# Patient Record
Sex: Male | Born: 1990 | Race: Black or African American | Hispanic: No | Marital: Single | Smoking: Current some day smoker
Health system: Southern US, Community
[De-identification: ages and names within clinical notes are randomized; demographics above are authoritative.]

## PROBLEM LIST (undated history)

## (undated) HISTORY — PX: HIP SURGERY: SHX245

---
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
## 2010-02-28 ENCOUNTER — Emergency Department (HOSPITAL_COMMUNITY): Admission: EM | Admit: 2010-02-28 | Discharge: 2010-02-28 | Payer: Self-pay | Admitting: Emergency Medicine

## 2010-09-07 NOTE — H&P (Signed)
NAME:  ILYA, NEELY                          ACCOUNT NO.:  [id number]   MEDICAL RECORD NO.:  [id number]                   PATIENT TYPE:  INP   LOCATION:  A315                                 FACILITY:  [facility]   PHYSICIAN:  J. Darreld Mclean, M.D.              DATE OF BIRTH:  02-Jan-1991   DATE OF ADMISSION:  06/17/2002  DATE OF DISCHARGE:                                HISTORY & PHYSICAL   CHIEF COMPLAINT:  I hurt my hip.   HISTORY OF PRESENT ILLNESS:  The patient is a 20 year old male who fell on  his right hip and had severe pain and tenderness.  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   The patient had previous slipped capital femoral epiphysis on the left hip  that had a pinning approximately two years ago.  He had some limping and  some earlier pain in his leg first part of the week.  Mother thought it was  related to his flat feet, but after he fell was in pain.   PAST SURGICAL HISTORY:  His only surgery was on the left hip, same  condition.   ALLERGIES:  The patient does not have any allergies.   MEDICATIONS:  Taking no medications.   SOCIAL HISTORY:  He is in middle school.   PHYSICAL EXAMINATION:  VITAL SIGNS:  The patient weighs approximately 240  pounds and is approximately 5 feet 6.  HEENT:  Negative.  GENERAL:  He is alert, cooperative, oriented.  NECK:  Supple.  LUNGS:  Clear to P&A.  HEART:  Regular without murmur heard.  ABDOMEN:  Soft, nontender, obese.  EXTREMITIES:  Pain, tenderness motion of the right hip.  Right hip is in  traction.  Other extremities in normal limits.  CNS:  Intact.  SKIN:  Intact.   IMPRESSION:  Slipped capital femoral epiphysis right hip status post pinning  of the left slipped capital femoral epiphysis two years ago.   PLAN:  He came in.  Emergency room physician already talked to the  orthopedic physician that did his previous surgery  on his left hip at  [facility].  He was admitted here as it was snowing real bad and  ambulances were  not running.  He will be brought to the [facility]  physicians later today and see if they can make arrangements.  Meanwhile,  will keep him in [place] traction and appropriate analgesia for pain.  Mother  is very familiar with this, understands the situation.                                               Teola Bradley, M.D.  JWK/MEDQ  D:  06/18/2002  T:  06/18/2002  Job:  [id number]

## 2010-09-07 NOTE — Discharge Summary (Signed)
   NAME:  Jesus Short, Jesus Short                          ACCOUNT NO.:  [id number]   MEDICAL RECORD NO.:  [id number]                   PATIENT TYPE:  INP   LOCATION:  A315                                 FACILITY:  [facility]   PHYSICIAN:  J. Darreld Mclean, M.D.              DATE OF BIRTH:  1990-11-15   DATE OF ADMISSION:  DATE OF DISCHARGE:  06/18/2002                                 DISCHARGE SUMMARY   DISCHARGE DIAGNOSES:  1. 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  2. Status post pinning slipped capital femoral epiphysis 10 years ago.   DISCHARGE DISPOSITION:  The patient is discharged to [facility] where he is to undergo pinning of his right hip for slipped femoral  capital epiphysis.  He is to follow up with the physicians there since they  did his first surgery.   DISCHARGE STATUS:  Stable.   PROGNOSIS:  Good.   DISCHARGE MEDICATIONS:  None.   BRIEF HISTORY AND PRESENT AND HOSPITAL COURSE:  The patient is a 20 year old  African-American male who is admitted via the emergency room because of  severe pain to his right hip.  X-rays at the time of the emergency room  visit show that he has had a significant slipped femoral capital epiphysis  on the right.  Also there is a pin noted to the left hip from previous  procedure done 2 years ago for the same problem.   Mother stated that child was complaining of hip pain that she thought was  related to flat feet.  He had pain for a few weeks prior to admission to the  hospital.   The patient was then transferred on [facility] on  06/18/2002 for pinning of his right hip, slipped capital femoral epiphysis.     Candace Cruise, Doylene Bode, M.D.    BB/MEDQ  D:  07/06/2002  T:  07/06/2002  Job:  [id number]

## 2013-07-13 ENCOUNTER — Encounter (HOSPITAL_COMMUNITY): Payer: Self-pay | Admitting: Emergency Medicine

## 2013-07-13 ENCOUNTER — Emergency Department (HOSPITAL_COMMUNITY)
Admission: EM | Admit: 2013-07-13 | Discharge: 2013-07-13 | Disposition: A | Discharge status: Home / Self Care | Payer: Self-pay | Attending: Emergency Medicine | Admitting: Emergency Medicine

## 2013-07-13 ENCOUNTER — Emergency Department (HOSPITAL_COMMUNITY): Payer: Self-pay

## 2013-07-13 DIAGNOSIS — L309 Dermatitis, unspecified: Secondary | ICD-10-CM

## 2013-07-13 DIAGNOSIS — Y9301 Activity, walking, marching and hiking: Secondary | ICD-10-CM | POA: Insufficient documentation

## 2013-07-13 DIAGNOSIS — L259 Unspecified contact dermatitis, unspecified cause: Secondary | ICD-10-CM | POA: Insufficient documentation

## 2013-07-13 DIAGNOSIS — F17200 Nicotine dependence, unspecified, uncomplicated: Secondary | ICD-10-CM | POA: Insufficient documentation

## 2013-07-13 DIAGNOSIS — Y9289 Other specified places as the place of occurrence of the external cause: Secondary | ICD-10-CM | POA: Insufficient documentation

## 2013-07-13 DIAGNOSIS — Y990 Civilian activity done for income or pay: Secondary | ICD-10-CM | POA: Insufficient documentation

## 2013-07-13 DIAGNOSIS — X500XXA Overexertion from strenuous movement or load, initial encounter: Secondary | ICD-10-CM | POA: Insufficient documentation

## 2013-07-13 DIAGNOSIS — S93409A Sprain of unspecified ligament of unspecified ankle, initial encounter: Secondary | ICD-10-CM | POA: Insufficient documentation

## 2013-07-13 MED ORDER — IBUPROFEN 800 MG PO TABS: 800.0000 mg | ORAL_TABLET | Freq: Three times a day (TID) | ORAL | Status: DC

## 2013-07-13 MED ORDER — IBUPROFEN 800 MG PO TABS
800.0000 mg | ORAL_TABLET | Freq: Once | ORAL | Status: AC
  Administered 2013-07-13: 800 mg via ORAL
  Filled 2013-07-13: qty 1

## 2013-07-13 MED ORDER — TRIAMCINOLONE ACETONIDE 0.1 % EX CREA: 1.0000 "application " | TOPICAL_CREAM | Freq: Two times a day (BID) | CUTANEOUS | Status: AC

## 2013-07-13 NOTE — ED Provider Notes (Signed)
CSN: [id number]     Arrival date & time 07/13/13  1857 History   First MD Initiated Contact with Patient 07/13/13 2004     Chief Complaint  Patient presents with  . Ankle Injury     (Consider location/radiation/quality/duration/timing/severity/associated sxs/prior Treatment) HPI Comments: Patient states he slipped on some muddy unlevel ground while at work today, and twisted his right ankle. He's been unable to apply weight without pain since that time. Patient is a 23 y.o. male presenting with ankle pain. The history is provided by the patient.  Ankle Pain Location:  Ankle Time since incident:  8 hours Injury: yes   Mechanism of injury comment:  Pt twisted the ankle while walking on unlevel ground Ankle location:  R ankle Pain details:    Quality:  Aching   Radiates to:  Does not radiate   Severity:  Moderate   Onset quality:  Sudden   Duration:  8 hours   Timing:  Constant   Progression:  Worsening Chronicity:  New Dislocation: no   Prior injury to area:  No Relieved by:  Nothing Exacerbated by: standing and walking. Associated symptoms: no back pain and no neck pain   Risk factors: no recent illness     History reviewed. No pertinent past medical history. Past Surgical History  Procedure Laterality Date  . Hip surgery     History reviewed. No pertinent family history. History  Substance Use Topics  . Smoking status: Current Every Day Smoker -- 0.50 packs/day  . Smokeless tobacco: Not on file  . Alcohol Use: Yes     Comment: occasional    Review of Systems  Constitutional: Negative for activity change.       All ROS Neg except as noted in HPI  HENT: Negative for nosebleeds.   Eyes: Negative for photophobia and discharge.  Respiratory: Negative for cough, shortness of breath and wheezing.   Cardiovascular: Negative for chest pain and palpitations.  Gastrointestinal: Negative for abdominal pain and blood in stool.  Genitourinary: Negative for dysuria,  frequency and hematuria.  Musculoskeletal: Negative for arthralgias, back pain and neck pain.  Skin: Negative.   Neurological: Negative for dizziness, seizures and speech difficulty.  Psychiatric/Behavioral: Negative for hallucinations and confusion.      Allergies  Review of patient's allergies indicates not on file.  Home Medications  No current outpatient prescriptions on file. BP 145/86  Pulse 76  Temp(Src) 98.6 F (37 C) (Oral)  Resp 18  Ht 5\' 6"  (1.676 m)  Wt 280 lb (127.007 kg)  BMI 45.21 kg/m2  SpO2 100% Physical Exam  Nursing note and vitals reviewed. Constitutional: He is oriented to person, place, and time. He appears well-developed and well-nourished.  Non-toxic appearance.  HENT:  Head: Normocephalic.  Right Ear: Tympanic membrane and external ear normal.  Left Ear: Tympanic membrane and external ear normal.  Eyes: EOM and lids are normal. Pupils are equal, round, and reactive to light.  Neck: Normal range of motion. Neck supple. Carotid bruit is not present.  Dry scaling rash of the neck. No red streaking. No drainage.  Cardiovascular: Normal rate, regular rhythm, normal heart sounds, intact distal pulses and normal pulses.   Pulmonary/Chest: Breath sounds normal. No respiratory distress.  Abdominal: Soft. Bowel sounds are normal. There is no tenderness. There is no guarding.  Musculoskeletal: Normal range of motion.  There is pain and moderate swelling of the right lateral malleolus. The Achilles tendon on the right is intact. The capillary refill is less  than 2 seconds. The dorsalis pedis pulse on the right and left 2+. There is full range of motion of the right hip and knee.  Lymphadenopathy:       Head (right side): No submandibular adenopathy present.       Head (left side): No submandibular adenopathy present.    He has no cervical adenopathy.  Neurological: He is alert and oriented to person, place, and time. He has normal strength. No cranial nerve  deficit or sensory deficit.  Skin: Skin is warm and dry.  Psychiatric: He has a normal mood and affect. His speech is normal.    ED Course  Procedures (including critical care time) Labs Review Labs Reviewed - No data to display Imaging Review Dg Ankle Complete Right  07/13/2013   CLINICAL DATA:  Lateral right ankle pain.  Injury at work.  EXAM: RIGHT ANKLE - COMPLETE 3+ VIEW  COMPARISON:  None.  FINDINGS: Mild soft swelling is present over the lateral malleolus. There is no underlying fracture. The ankle joint is located. No significant effusion is present.  IMPRESSION: Mild soft tissue swelling over the lateral malleolus without an underlying fracture. Ligamentous injury is not excluded.   Electronically Signed   By: Gennette Pac M.D.   On: 07/13/2013 20:14     EKG Interpretation None      MDM Xray of the right ankle is negative for fx or dislocation. Pt fitted with ASO splint.Rx for ibuprofen 800mg  given and an ice pack given. Pt has eczema present. Rx for triamcinolone given to the patient.   Final diagnoses:  None    **I have reviewed nursing notes, vital signs, and all appropriate lab and imaging results for this patient.Kathie Dike, PA-C 07/16/13 1233

## 2013-07-13 NOTE — Discharge Instructions (Signed)
Please apply ice to your right ankle. Please keep your ankle elevated above your waist is much as possible. Use crutches until you can safely apply weight to your foot and ankle. Please see the orthopedic specialist listed above, or the orthopedic specialist of your choice if not improving. Please use the ankle stirrup splint for the next 7-10 days. Ankle Sprain An ankle sprain is an injury to the strong, fibrous tissues (ligaments) that hold the bones of your ankle joint together.  CAUSES An ankle sprain is usually caused by a fall or by twisting your ankle. Ankle sprains most commonly occur when you step on the outer edge of your foot, and your ankle turns inward. People who participate in sports are more prone to these types of injuries.  SYMPTOMS   Pain in your ankle. The pain may be present at rest or only when you are trying to stand or walk.  Swelling.  Bruising. Bruising may develop immediately or within 1 to 2 days after your injury.  Difficulty standing or walking, particularly when turning corners or changing directions. DIAGNOSIS  Your caregiver will ask you details about your injury and perform a physical exam of your ankle to determine if you have an ankle sprain. During the physical exam, your caregiver will press on and apply pressure to specific areas of your foot and ankle. Your caregiver will try to move your ankle in certain ways. An X-ray exam may be done to be sure a bone was not broken or a ligament did not separate from one of the bones in your ankle (avulsion fracture).  TREATMENT  Certain types of braces can help stabilize your ankle. Your caregiver can make a recommendation for this. Your caregiver may recommend the use of medicine for pain. If your sprain is severe, your caregiver may refer you to a surgeon who helps to restore function to parts of your skeletal system (orthopedist) or a physical therapist. HOME CARE INSTRUCTIONS   Apply ice to your injury for 1 2 days  or as directed by your caregiver. Applying ice helps to reduce inflammation and pain.  Put ice in a plastic bag.  Place a towel between your skin and the bag.  Leave the ice on for 15-20 minutes at a time, every 2 hours while you are awake.  Only take over-the-counter or prescription medicines for pain, discomfort, or fever as directed by your caregiver.  Elevate your injured ankle above the level of your heart as much as possible for 2 3 days.  If your caregiver recommends crutches, use them as instructed. Gradually put weight on the affected ankle. Continue to use crutches or a cane until you can walk without feeling pain in your ankle.  If you have a plaster splint, wear the splint as directed by your caregiver. Do not rest it on anything harder than a pillow for the first 24 hours. Do not put weight on it. Do not get it wet. You may take it off to take a shower or bath.  You may have been given an elastic bandage to wear around your ankle to provide support. If the elastic bandage is too tight (you have numbness or tingling in your foot or your foot becomes cold and blue), adjust the bandage to make it comfortable.  If you have an air splint, you may blow more air into it or let air out to make it more comfortable. You may take your splint off at night and before taking a  shower or bath. Wiggle your toes in the splint several times per day to decrease swelling. SEEK MEDICAL CARE IF:   You have rapidly increasing bruising or swelling.  Your toes feel extremely cold or you lose feeling in your foot.  Your pain is not relieved with medicine. SEEK IMMEDIATE MEDICAL CARE IF:  Your toes are numb or blue.  You have severe pain that is increasing. MAKE SURE YOU:   Understand these instructions.  Will watch your condition.  Will get help right away if you are not doing well or get worse. Document Released: 04/08/2005 Document Revised: 01/01/2012 Document Reviewed:  04/20/2011 [employer] Patient Information 2014 [place], [place].

## 2013-07-13 NOTE — ED Notes (Signed)
Pt reports he twisted his right ankle on unlevel ground.  States that pain got worse through day as he walked around.

## 2013-07-13 NOTE — ED Notes (Signed)
Pain, swelling rt ankle Twisted today at work

## 2013-07-17 NOTE — ED Provider Notes (Signed)
Medical screening examination/treatment/procedure(s) were performed by non-physician practitioner and as supervising physician I was immediately available for consultation/collaboration.   EKG Interpretation None        Valecia Beske, MD 07/17/13 0940 

## 2015-06-06 ENCOUNTER — Encounter (HOSPITAL_COMMUNITY): Payer: Self-pay | Admitting: *Deleted

## 2015-06-06 ENCOUNTER — Emergency Department (HOSPITAL_COMMUNITY)
Admission: EM | Admit: 2015-06-06 | Discharge: 2015-06-07 | Disposition: A | Discharge status: Home / Self Care | Payer: Self-pay | Attending: Emergency Medicine | Admitting: Emergency Medicine

## 2015-06-06 DIAGNOSIS — Z7952 Long term (current) use of systemic steroids: Secondary | ICD-10-CM | POA: Insufficient documentation

## 2015-06-06 DIAGNOSIS — M791 Myalgia, unspecified site: Secondary | ICD-10-CM

## 2015-06-06 DIAGNOSIS — R531 Weakness: Secondary | ICD-10-CM | POA: Insufficient documentation

## 2015-06-06 DIAGNOSIS — F17200 Nicotine dependence, unspecified, uncomplicated: Secondary | ICD-10-CM | POA: Insufficient documentation

## 2015-06-06 DIAGNOSIS — R062 Wheezing: Secondary | ICD-10-CM | POA: Insufficient documentation

## 2015-06-06 DIAGNOSIS — R059 Cough, unspecified: Secondary | ICD-10-CM

## 2015-06-06 DIAGNOSIS — R05 Cough: Secondary | ICD-10-CM | POA: Insufficient documentation

## 2015-06-06 DIAGNOSIS — J029 Acute pharyngitis, unspecified: Secondary | ICD-10-CM | POA: Insufficient documentation

## 2015-06-06 MED ORDER — ALBUTEROL SULFATE HFA 108 (90 BASE) MCG/ACT IN AERS
2.0000 | INHALATION_SPRAY | RESPIRATORY_TRACT | Status: DC | PRN
  Administered 2015-06-07: 2 via RESPIRATORY_TRACT
  Filled 2015-06-06: qty 6.7

## 2015-06-06 NOTE — ED Notes (Signed)
Pt reports slight fever x 2 days. Pt also reports productive cough.. Pt states " Really I just need a note for work". Pt in no acute distress and well appearing.

## 2015-06-06 NOTE — ED Notes (Signed)
Pt c/o having cold symptoms and feeling weak like he was going to pass out

## 2015-06-06 NOTE — ED Provider Notes (Signed)
CSN: [id number]     Arrival date & time 06/06/15  2127 History  By signing my name below, I, Bethel Born, attest that this documentation has been prepared under the direction and in the presence of Shon Baton, MD. Electronically Signed: Bethel Born, ED Scribe. 06/06/2015. 11:56 PM   Chief Complaint  Patient presents with  . Generalized Body Aches    The history is provided by the patient. No language interpreter was used.   Jesus Short is a 25 y.o. male who presents to the Emergency Department complaining of a subjective fever and chills with onset 2 days ago. He did not measure his fever at home but states that he knows that he has one. Associated symptoms include generalized weakness, cough, and sore throat. He used Nyquil last night. Pt denies nausea, vomiting, chest pain, and SOB.  Pt also denies known sick contact. He is a smoker. Pt is otherwise healthy and takes no daily medication. NKDA.   History reviewed. No pertinent past medical history. Past Surgical History  Procedure Laterality Date  . Hip surgery     History reviewed. No pertinent family history. Social History  Substance Use Topics  . Smoking status: Current Some Day Smoker -- 0.00 packs/day  . Smokeless tobacco: None  . Alcohol Use: Yes     Comment: occasional    Review of Systems  Constitutional: Positive for chills. Fever: subjective.  HENT: Positive for sore throat.   Respiratory: Positive for cough.   Cardiovascular: Negative for chest pain.  Gastrointestinal: Negative for nausea and vomiting.  Neurological: Positive for weakness (generalized).  All other systems reviewed and are negative.   Allergies  Review of patient's allergies indicates no known allergies.  Home Medications   Prior to Admission medications   Medication Sig Start Date End Date Taking? Authorizing Provider  albuterol (PROVENTIL HFA;VENTOLIN HFA) 108 (90 Base) MCG/ACT inhaler Inhale 2 puffs into the lungs every 4  (four) hours as needed for shortness of breath (cough). 06/07/15   Shon Baton, MD  ibuprofen (ADVIL,MOTRIN) 600 MG tablet Take 1 tablet (600 mg total) by mouth every 6 (six) hours as needed for fever or moderate pain. 06/07/15   Shon Baton, MD  triamcinolone cream (KENALOG) 0.1 % Apply 1 application topically 2 (two) times daily. 07/13/13   Ivery Quale, PA-C   BP 108/78 mmHg  Pulse 93  Temp(Src) 100 F (37.8 C) (Oral)  Resp 20  Wt 280 lb (127.007 kg)  SpO2 98% Physical Exam  Constitutional: He is oriented to person, place, and time. He appears well-developed and well-nourished. No distress.  HENT:  Head: Normocephalic and atraumatic.  Cardiovascular: Normal rate, regular rhythm and normal heart sounds.   No murmur heard. Pulmonary/Chest: Effort normal. No respiratory distress. He has wheezes.  Scant expiratory wheeze  Abdominal: Soft. Bowel sounds are normal. There is no tenderness. There is no rebound.  Musculoskeletal: He exhibits no edema.  Neurological: He is alert and oriented to person, place, and time.  Skin: Skin is warm and dry.  Psychiatric: He has a normal mood and affect.  Nursing note and vitals reviewed.   ED Course  Procedures (including critical care time) DIAGNOSTIC STUDIES: Oxygen Saturation is 98% on RA,  normal by my interpretation.    COORDINATION OF CARE: 11:46 PM Discussed treatment plan which includes CXR, EKG, and a breathing treatment with pt at bedside and pt agreed to plan.  Labs Review Labs Reviewed - No data to display  Imaging Review Dg Chest 2 View  06/07/2015  CLINICAL DATA:  25 year old male with fever and productive cough. EXAM: CHEST  2 VIEW COMPARISON:  None. FINDINGS: Two views of the chest do not demonstrate a focal consolidation. There is mild increased interstitial prominence at the left lung base, likely atelectatic changes. Developing pneumonia is less likely. Clinical correlation is recommended. No pleural effusion or  pneumothorax. The cardiac silhouette is within normal limits. No acute osseous pathology. IMPRESSION: No focal consolidation. Electronically Signed   By: Elgie Collard M.D.   On: 06/07/2015 01:16   I have personally reviewed and evaluated these images as part of my medical decision-making.   EKG Interpretation   Date/Time:  Tuesday June 06 2015 23:59:47 EST Ventricular Rate:  87 PR Interval:  136 QRS Duration: 84 QT Interval:  332 QTC Calculation: 399 R Axis:   68 Text Interpretation:  Sinus rhythm Confirmed by HORTON  MD, COURTNEY  ([id number]) on 06/07/2015 1:10:48 AM      MDM   Final diagnoses:  Myalgia  Cough    Patient presents with chills, myalgia, and cough. Nontoxic on exam. Temperature 100. Exam is largely reassuring. Scant wheeze. Patient is a smoker. He was given an inhaler. He told nursing that he felt like "used to pass out. Denies syncope. EKG and orthostatics reassuring. He is able to tolerate fluids. X-ray shows no evidence of pneumonia. Suspect acute viral etiology. Supportive management at home.  After history, exam, and medical workup I feel the patient has been appropriately medically screened and is safe for discharge home. Pertinent diagnoses were discussed with the patient. Patient was given return precautions.   I personally performed the services described in this documentation, which was scribed in my presence. The recorded information has been reviewed and is accurate.    Shon Baton, MD 06/07/15 [phone]

## 2015-06-07 ENCOUNTER — Emergency Department (HOSPITAL_COMMUNITY): Payer: Self-pay

## 2015-06-07 MED ORDER — IBUPROFEN 600 MG PO TABS: 600.0000 mg | ORAL_TABLET | Freq: Four times a day (QID) | ORAL | Status: DC | PRN

## 2015-06-07 MED ORDER — ALBUTEROL SULFATE HFA 108 (90 BASE) MCG/ACT IN AERS: 2.0000 | INHALATION_SPRAY | RESPIRATORY_TRACT | Status: AC | PRN

## 2015-06-07 NOTE — Discharge Instructions (Signed)
Viral Infections °A viral infection can be caused by different types of viruses. Most viral infections are not serious and resolve on their own. However, some infections may cause severe symptoms and may lead to further complications. °SYMPTOMS °Viruses can frequently cause: °· Minor sore throat. °· Aches and pains. °· Headaches. °· Runny nose. °· Different types of rashes. °· Watery eyes. °· Tiredness. °· Cough. °· Loss of appetite. °· Gastrointestinal infections, resulting in nausea, vomiting, and diarrhea. °These symptoms do not respond to antibiotics because the infection is not caused by bacteria. However, you might catch a bacterial infection following the viral infection. This is sometimes called a "superinfection." Symptoms of such a bacterial infection may include: °· Worsening sore throat with pus and difficulty swallowing. °· Swollen neck glands. °· Chills and a high or persistent fever. °· Severe headache. °· Tenderness over the sinuses. °· Persistent overall ill feeling (malaise), muscle aches, and tiredness (fatigue). °· Persistent cough. °· Yellow, green, or brown mucus production with coughing. °HOME CARE INSTRUCTIONS  °· Only take over-the-counter or prescription medicines for pain, discomfort, diarrhea, or fever as directed by your caregiver. °· Drink enough water and fluids to keep your urine clear or pale yellow. Sports drinks can provide valuable electrolytes, sugars, and hydration. °· Get plenty of rest and maintain proper nutrition. Soups and broths with crackers or rice are fine. °SEEK IMMEDIATE MEDICAL CARE IF:  °· You have severe headaches, shortness of breath, chest pain, neck pain, or an unusual rash. °· You have uncontrolled vomiting, diarrhea, or you are unable to keep down fluids. °· You or your child has an oral temperature above 102° F (38.9° C), not controlled by medicine. °· Your baby is older than 3 months with a rectal temperature of 102° F (38.9° C) or higher. °· Your baby is 3  months old or younger with a rectal temperature of 100.4° F (38° C) or higher. °MAKE SURE YOU:  °· Understand these instructions. °· Will watch your condition. °· Will get help right away if you are not doing well or get worse. °  °This information is not intended to replace advice given to you by your health care provider. Make sure you discuss any questions you have with your health care provider. °  °Document Released: 01/16/2005 Document Revised: 07/01/2011 Document Reviewed: 09/14/2014 °Elsevier Interactive Patient Education ©2016 [employer]. ° °

## 2017-01-04 ENCOUNTER — Emergency Department (HOSPITAL_COMMUNITY): Payer: Self-pay

## 2017-01-04 ENCOUNTER — Encounter (HOSPITAL_COMMUNITY): Payer: Self-pay | Admitting: Emergency Medicine

## 2017-01-04 ENCOUNTER — Emergency Department (HOSPITAL_COMMUNITY)
Admission: EM | Admit: 2017-01-04 | Discharge: 2017-01-04 | Disposition: A | Discharge status: Home / Self Care | Payer: Self-pay | Attending: Emergency Medicine | Admitting: Emergency Medicine

## 2017-01-04 DIAGNOSIS — M47896 Other spondylosis, lumbar region: Secondary | ICD-10-CM | POA: Insufficient documentation

## 2017-01-04 DIAGNOSIS — F17210 Nicotine dependence, cigarettes, uncomplicated: Secondary | ICD-10-CM | POA: Insufficient documentation

## 2017-01-04 DIAGNOSIS — M62830 Muscle spasm of back: Secondary | ICD-10-CM | POA: Insufficient documentation

## 2017-01-04 MED ORDER — IBUPROFEN 600 MG PO TABS: 600.0000 mg | ORAL_TABLET | Freq: Four times a day (QID) | ORAL | 0 refills | Status: DC

## 2017-01-04 MED ORDER — CYCLOBENZAPRINE HCL 10 MG PO TABS: 10.0000 mg | ORAL_TABLET | Freq: Three times a day (TID) | ORAL | 0 refills | Status: AC

## 2017-01-04 NOTE — ED Triage Notes (Signed)
Patient c/o lower back pain that radiates into right leg. Patient states started Thursday when he stood up from couch. Denies any complications with BMs or urination. Per patient feels like a spasm.

## 2017-01-04 NOTE — Discharge Instructions (Signed)
This no fracture or dislocation involving her back. 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 Use heating pad to the lower back. Please use Flexeril 3 times daily. Use ibuprofen every 6 hours. Flexeril may cause drowsiness. Please do not drive, drink alcohol, operate machinery, or participated in activities requiring concentration when taking this medication.

## 2017-01-04 NOTE — ED Provider Notes (Signed)
[facility]-EMERGENCY DEPT Provider Note   CSN: [id number] Arrival date & time: 01/04/17  1428     History   Chief Complaint Chief Complaint  Patient presents with  . Back Pain    HPI Jesus Short is a 26 y.o. male.  The history is provided by the patient.  Back Pain   This is a chronic problem. The current episode started 2 days ago. The problem occurs constantly. The problem has been gradually worsening. The pain is associated with no known injury (Hx of right hip deformity.). The pain is present in the lumbar spine. The quality of the pain is described as aching and shooting. The pain radiates to the right thigh. The pain is moderate. The symptoms are aggravated by bending and certain positions. The pain is the same all the time. Stiffness is present all day. Pertinent negatives include no chest pain, no fever, no numbness, no abdominal pain, no bowel incontinence, no perianal numbness, no bladder incontinence and no dysuria. He has tried nothing for the symptoms.    No past medical history on file.  Patient Active Problem List   Diagnosis Date Noted  . ALLERGIC RHINITIS 08/06/2006  . OBESITY, NOS 06/19/2006  . 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 CONSTIPATION 06/19/2006    Past Surgical History:  Procedure Laterality Date  . HIP SURGERY         Home Medications    Prior to Admission medications   Medication Sig Start Date End Date Taking? Authorizing Provider  albuterol (PROVENTIL HFA;VENTOLIN HFA) 108 (90 Base) MCG/ACT inhaler Inhale 2 puffs into the lungs every 4 (four) hours as needed for shortness of breath (cough). 06/07/15   Horton, Mayer Masker, MD  ibuprofen (ADVIL,MOTRIN) 600 MG tablet Take 1 tablet (600 mg total) by mouth every 6 (six) hours as needed for fever or moderate pain. 06/07/15   Horton, Mayer Masker, MD  triamcinolone cream (KENALOG) 0.1 % Apply 1 application topically 2 (two) times daily. 07/13/13   Ivery Quale, PA-C    Family  History No family history on file.  Social History Social History  Substance Use Topics  . Smoking status: Current Some Day Smoker    Packs/day: 0.25    Types: Cigarettes  . Smokeless tobacco: Never Used  . Alcohol use Yes     Comment: occasional     Allergies   Patient has no known allergies.   Review of Systems Review of Systems  Constitutional: Negative for activity change and fever.       All ROS Neg except as noted in HPI  HENT: Negative for nosebleeds.   Eyes: Negative for photophobia and discharge.  Respiratory: Negative for cough, shortness of breath and wheezing.   Cardiovascular: Negative for chest pain and palpitations.  Gastrointestinal: Negative for abdominal pain, blood in stool and bowel incontinence.  Genitourinary: Negative for bladder incontinence, dysuria, frequency and hematuria.  Musculoskeletal: Positive for back pain. Negative for arthralgias and neck pain.  Skin: Negative.   Neurological: Negative for dizziness, seizures, speech difficulty and numbness.  Psychiatric/Behavioral: Negative for confusion and hallucinations.     Physical Exam Updated Vital Signs BP (!) 141/93 (BP Location: Right Arm)   Pulse 65   Temp 98.3 F (36.8 C) (Oral)   Resp 18   Ht  (1.778 m)   Wt 111.1 kg (245 lb)   SpO2 100%   BMI 35.15 kg/m   Physical Exam  Constitutional: He is oriented to person, place, and time. He  appears well-developed and well-nourished.  Non-toxic appearance.  HENT:  Head: Normocephalic.  Right Ear: Tympanic membrane and external ear normal.  Left Ear: Tympanic membrane and external ear normal.  Eyes: Pupils are equal, round, and reactive to light. EOM and lids are normal.  Neck: Normal range of motion. Neck supple. Carotid bruit is not present.  Cardiovascular: Normal rate, regular rhythm, normal heart sounds, intact distal pulses and normal pulses.   Pulmonary/Chest: Breath sounds normal. No respiratory distress.  Abdominal: Soft.  Bowel sounds are normal. There is no tenderness. There is no guarding.  Musculoskeletal:       Lumbar back: He exhibits decreased range of motion, tenderness, pain and spasm.  Lymphadenopathy:       Head (right side): No submandibular adenopathy present.       Head (left side): No submandibular adenopathy present.    He has no cervical adenopathy.  Neurological: He is alert and oriented to person, place, and time. He has normal strength. No cranial nerve deficit or sensory deficit.  No motor or sensory deficit noted.  Skin: Skin is warm and dry.  Psychiatric: He has a normal mood and affect. His speech is normal.  Nursing note and vitals reviewed.    ED Treatments / Results  Labs (all labs ordered are listed, but only abnormal results are displayed) Labs Reviewed - No data to display  EKG  EKG Interpretation None       Radiology No results found.  Procedures Procedures (including critical care time)  Medications Ordered in ED Medications - No data to display   Initial Impression / Assessment and Plan / ED Course  I have reviewed the triage vital signs and the nursing notes.  Pertinent labs & imaging results that were available during my care of the patient were reviewed by me and considered in my medical decision making (see chart for details).       Final Clinical Impressions(s) / ED Diagnoses MDM Vital signs within normal limits.x-ray of the lumbar spine shows mild degenerative changes at T11-T12.  Patient has spasm pain with change of position. No gross neurologic deficits appreciated.  Pt advised to use a heating pad. He will use a muscle relaxer. I have encouraged the patient to see orthopedics for additional evalluation of the back and the degenerative changes.   Final diagnoses:  Spasm of lumbar paraspinous muscle  Other osteoarthritis of spine, lumbar region    New Prescriptions Discharge Medication List as of 01/04/2017  5:38 PM       Ivery Quale, PA-C 01/05/17 Kerry Hough, MD 01/05/17 1513

## 2017-02-08 IMAGING — DX DG CHEST 2V
2 series · 2 of 2 positions shown · non-contrast
Comparison: None.

CLINICAL DATA: 24-year-old male with fever and productive cough.

EXAM:
CHEST  2 VIEW

[chest pa]
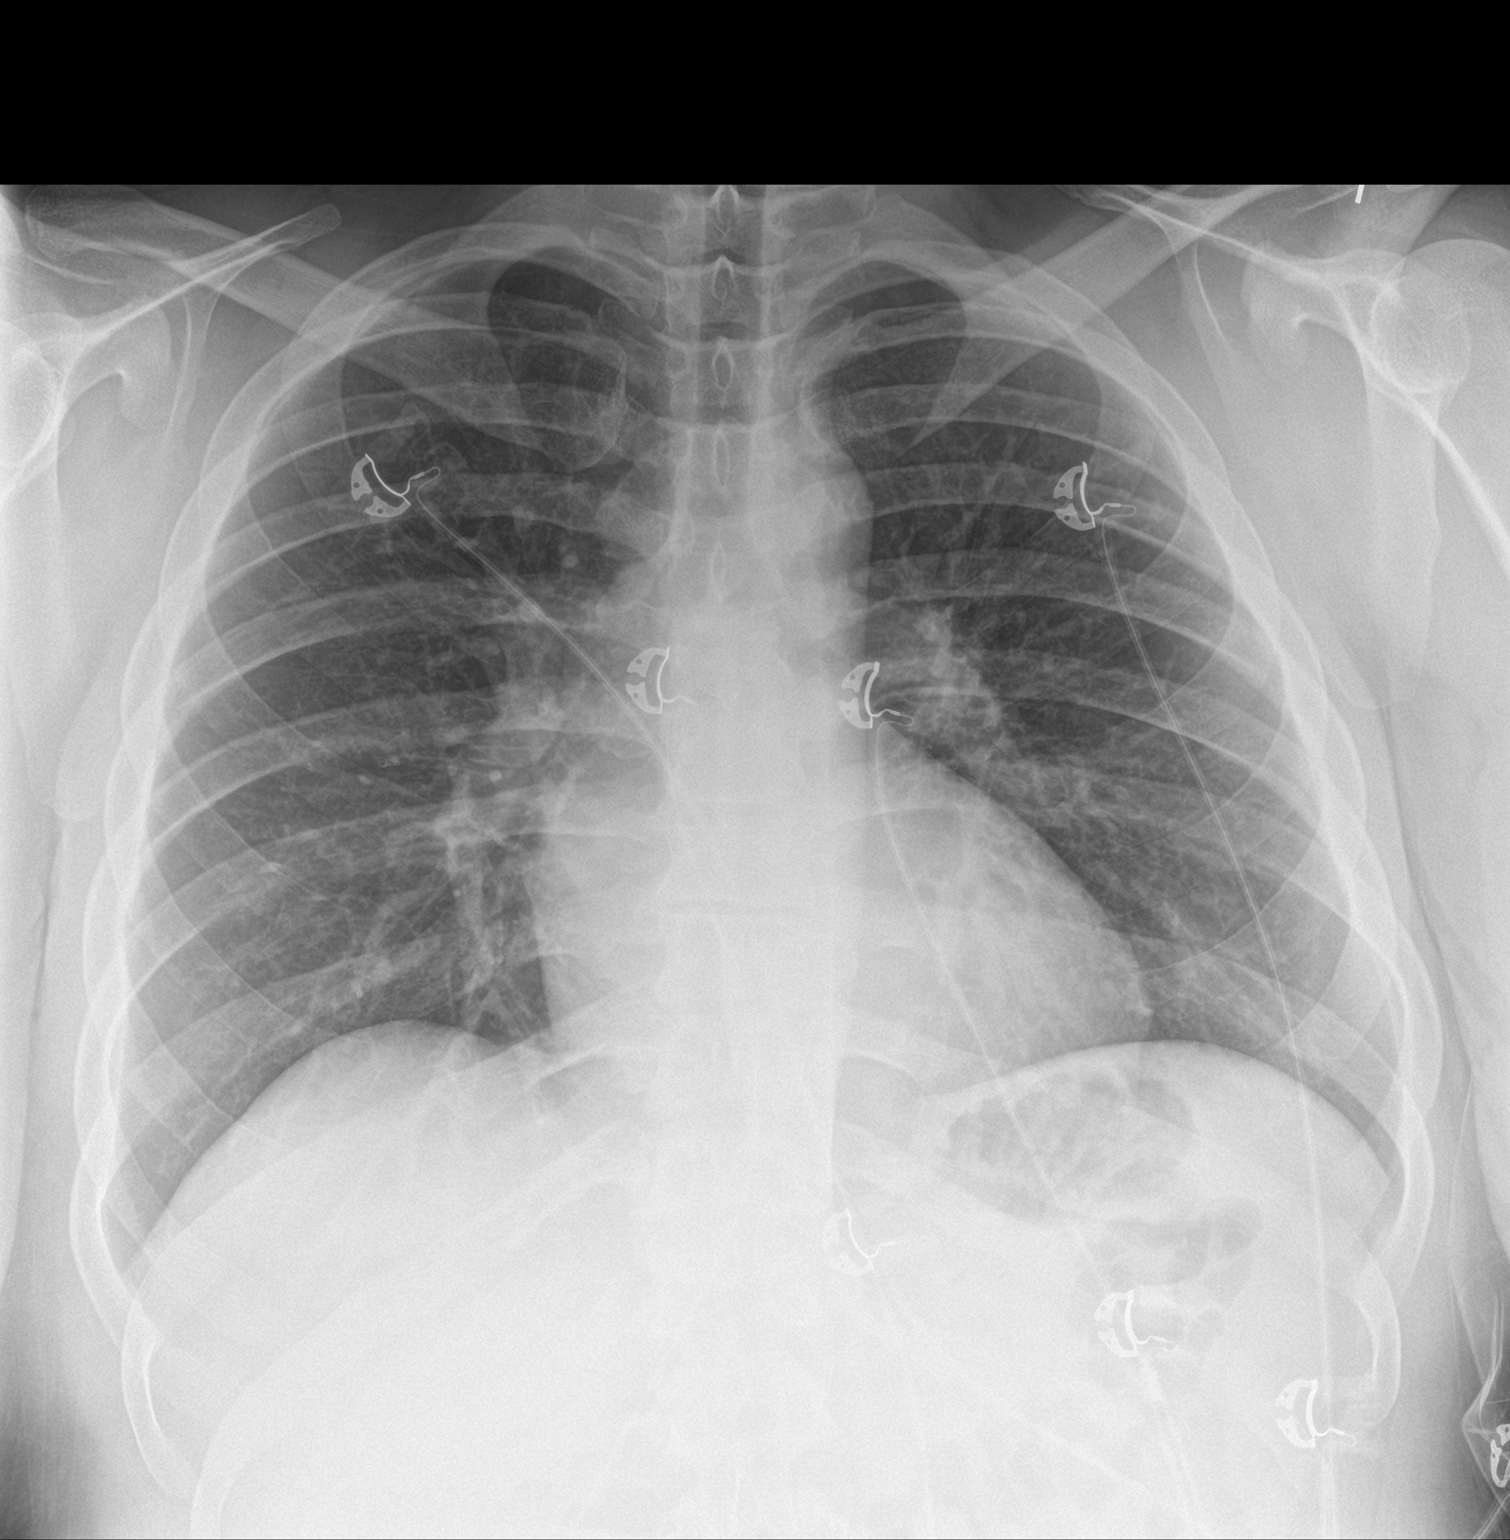

[chest lat]
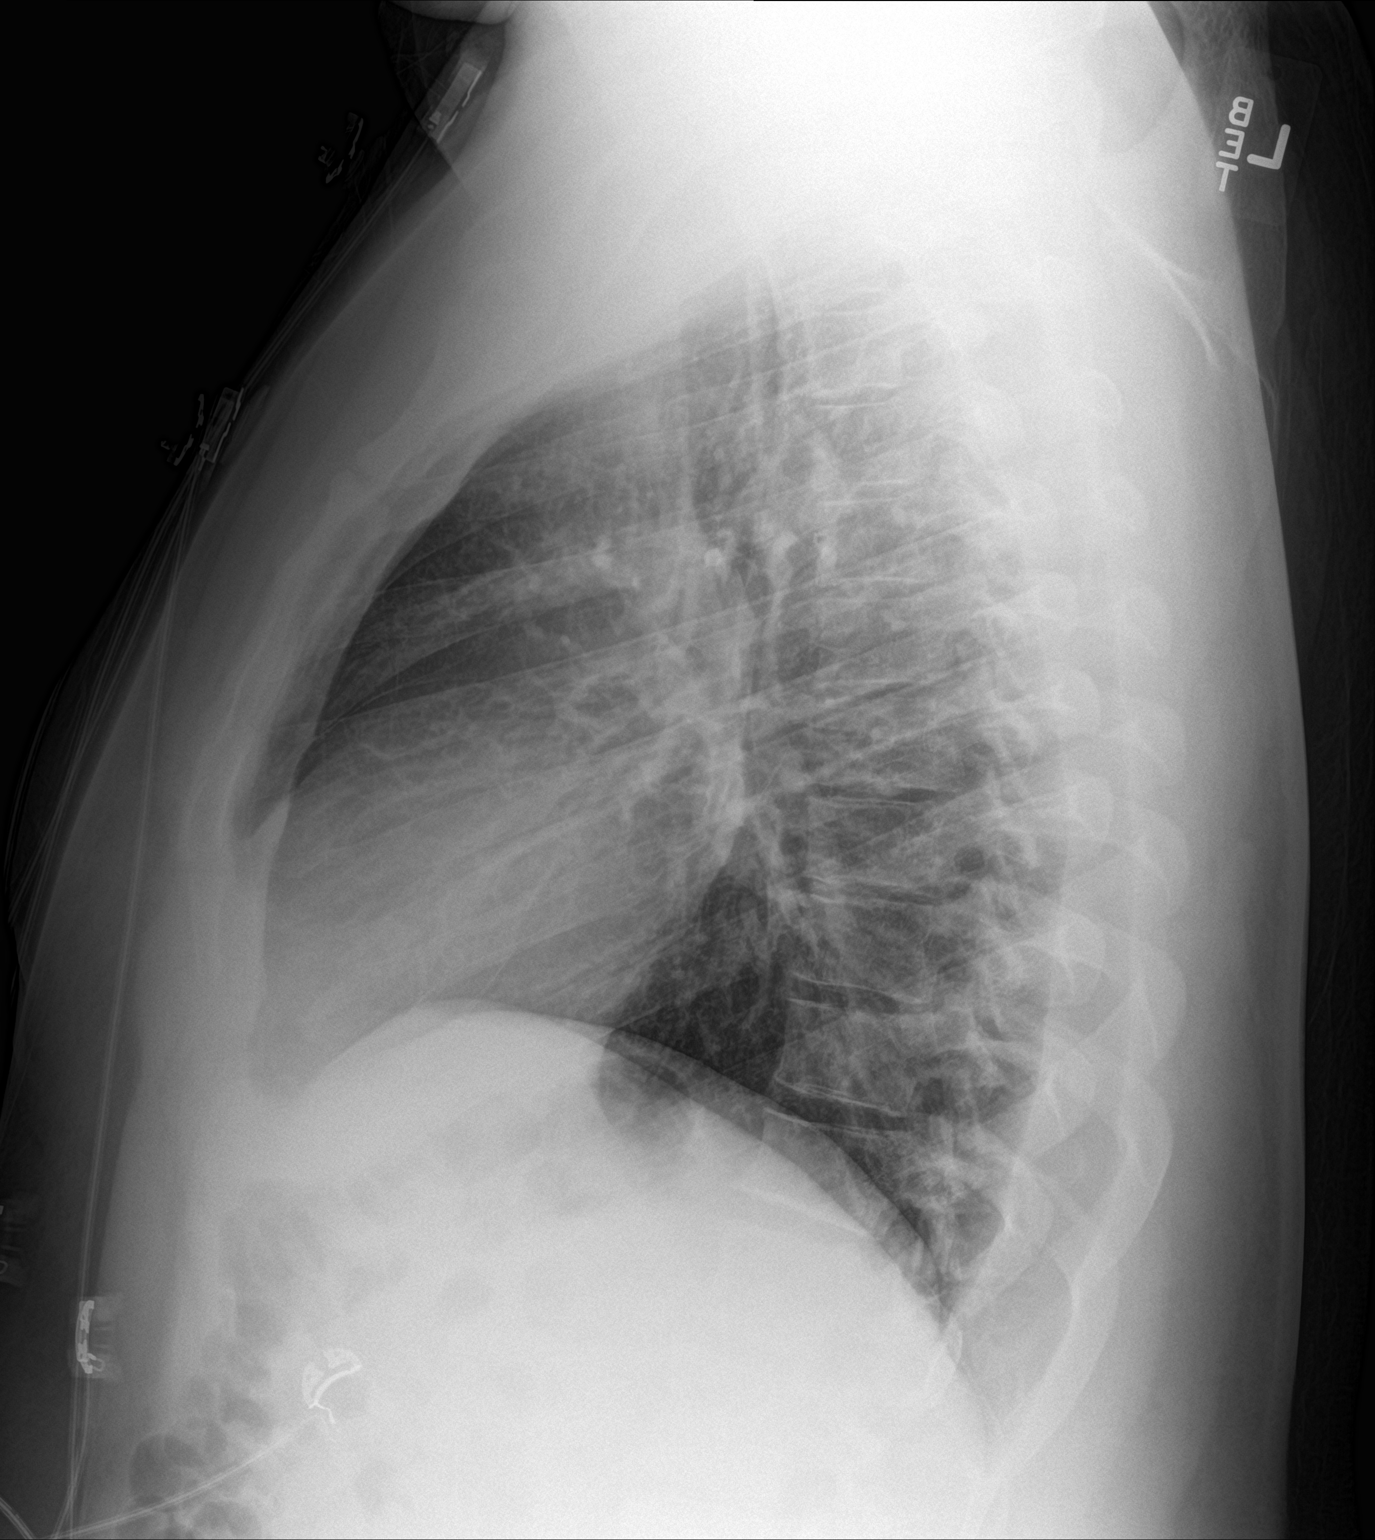

[2 of 2 positions shown; findings below may reference images not displayed]

FINDINGS: Two views of the chest do not demonstrate a focal consolidation.
There is mild increased interstitial prominence at the left lung
base, likely atelectatic changes. Developing pneumonia is less
likely. Clinical correlation is recommended. No pleural effusion or
pneumothorax. The cardiac silhouette is within normal limits. No
acute osseous pathology.
IMPRESSION: No focal consolidation.

## 2018-01-04 ENCOUNTER — Emergency Department (HOSPITAL_COMMUNITY)
Admission: EM | Admit: 2018-01-04 | Discharge: 2018-01-04 | Disposition: A | Discharge status: Home / Self Care | Payer: 59 | Attending: Emergency Medicine | Admitting: Emergency Medicine

## 2018-01-04 ENCOUNTER — Encounter (HOSPITAL_COMMUNITY): Payer: Self-pay | Admitting: Emergency Medicine

## 2018-01-04 DIAGNOSIS — F17210 Nicotine dependence, cigarettes, uncomplicated: Secondary | ICD-10-CM | POA: Diagnosis not present

## 2018-01-04 DIAGNOSIS — J029 Acute pharyngitis, unspecified: Secondary | ICD-10-CM | POA: Insufficient documentation

## 2018-01-04 LAB — GROUP A STREP BY PCR: GROUP A STREP BY PCR: NOT DETECTED

## 2018-01-04 MED ORDER — IBUPROFEN 800 MG PO TABS
800.0000 mg | ORAL_TABLET | Freq: Once | ORAL | Status: AC
  Administered 2018-01-04: 800 mg via ORAL
  Filled 2018-01-04: qty 1

## 2018-01-04 MED ORDER — MAGIC MOUTHWASH W/LIDOCAINE: 10.0000 mL | Freq: Four times a day (QID) | ORAL | 0 refills | Status: AC | PRN

## 2018-01-04 MED ORDER — LIDOCAINE VISCOUS HCL 2 % MT SOLN
15.0000 mL | Freq: Once | OROMUCOSAL | Status: AC
  Administered 2018-01-04: 15 mL via OROMUCOSAL
  Filled 2018-01-04: qty 15

## 2018-01-04 MED ORDER — IBUPROFEN 800 MG PO TABS: 800.0000 mg | ORAL_TABLET | Freq: Three times a day (TID) | ORAL | 0 refills | Status: AC | PRN

## 2018-01-04 NOTE — Discharge Instructions (Addendum)
Your strep test is negative which means antibiotics will not make this better.   Use the medicines prescribed while your throat infection is running its course.  Rest and drink plenty of fluids.

## 2018-01-04 NOTE — ED Provider Notes (Signed)
[facility] EMERGENCY DEPARTMENT Provider Note   CSN: [id number] Arrival date & time: 01/04/18  1314     History   Chief Complaint Chief Complaint  Patient presents with  . Sore Throat    HPI Jesus Short is a 27 y.o. male.  The history is provided by the patient.  Sore Throat  This is a new problem. The current episode started yesterday. The problem occurs constantly. The problem has been gradually worsening. Pertinent negatives include no chest pain, no abdominal pain, no headaches and no shortness of breath. Associated symptoms comments: He denies nasal congestion, ear pain, headache, neck pain, sob, cough, no chest pain.  Subjective fever (chills).. The symptoms are aggravated by swallowing. Nothing relieves the symptoms. Treatments tried: otc throat spray. The treatment provided no relief.    History reviewed. No pertinent past medical history.  Patient Active Problem List   Diagnosis Date Noted  . ALLERGIC RHINITIS 08/06/2006  . OBESITY, NOS 06/19/2006  . 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 CONSTIPATION 06/19/2006    Past Surgical History:  Procedure Laterality Date  . HIP SURGERY          Home Medications    Prior to Admission medications   Medication Sig Start Date End Date Taking? Authorizing Provider  albuterol (PROVENTIL HFA;VENTOLIN HFA) 108 (90 Base) MCG/ACT inhaler Inhale 2 puffs into the lungs every 4 (four) hours as needed for shortness of breath (cough). 06/07/15   Horton, Mayer Masker, MD  cyclobenzaprine (FLEXERIL) 10 MG tablet Take 1 tablet (10 mg total) by mouth 3 (three) times daily. 01/04/17   Ivery Quale, PA-C  ibuprofen (ADVIL,MOTRIN) 800 MG tablet Take 1 tablet (800 mg total) by mouth every 8 (eight) hours as needed for fever or moderate pain. 01/04/18   Burgess Amor, PA-C  magic mouthwash w/lidocaine SOLN Take 10 mLs by mouth 4 (four) times daily as needed (throat pain.  Gargle and spit medication). Note to pharmacy - equal  parts diphendydramine, aluminum hydroxide and lidocaine HCL 01/04/18   Burgess Amor, PA-C  triamcinolone cream (KENALOG) 0.1 % Apply 1 application topically 2 (two) times daily. 07/13/13   Ivery Quale, PA-C    Family History History reviewed. No pertinent family history.  Social History Social History   Tobacco Use  . Smoking status: Current Some Day Smoker    Packs/day: 0.25    Types: Cigarettes  . Smokeless tobacco: Never Used  Substance Use Topics  . Alcohol use: Yes    Comment: occasional  . Drug use: No     Allergies   Patient has no known allergies.   Review of Systems Review of Systems  Constitutional: Positive for chills and fever.  HENT: Positive for sore throat. Negative for congestion, ear pain, rhinorrhea, sinus pressure, trouble swallowing and voice change.   Eyes: Negative for discharge.  Respiratory: Negative for cough, shortness of breath, wheezing and stridor.   Cardiovascular: Negative for chest pain.  Gastrointestinal: Negative for abdominal pain.  Genitourinary: Negative.   Neurological: Negative for headaches.     Physical Exam Updated Vital Signs BP 131/80 (BP Location: Right Arm)   Pulse 88   Temp 99 F (37.2 C) (Oral)   Resp 15   Ht 5\' 7"  (1.702 m)   Wt 133.8 kg   SpO2 97%   BMI 46.20 kg/m   Physical Exam  Constitutional: He is oriented to person, place, and time. He appears well-developed and well-nourished.  HENT:  Head: Normocephalic and atraumatic.  Right  Ear: Tympanic membrane and ear canal normal.  Left Ear: Tympanic membrane and ear canal normal.  Nose: No mucosal edema or rhinorrhea.  Mouth/Throat: Uvula is midline and mucous membranes are normal. Posterior oropharyngeal erythema present. No oropharyngeal exudate or posterior oropharyngeal edema. Tonsils are 2+ on the right. Tonsils are 2+ on the left. Tonsillar exudate.  Eyes: Conjunctivae are normal.  Cardiovascular: Normal rate and normal heart sounds.  Pulmonary/Chest:  Effort normal. No respiratory distress. He has no wheezes. He has no rales.  Abdominal: Soft. There is no tenderness.  Musculoskeletal: Normal range of motion.  Neurological: He is alert and oriented to person, place, and time.  Skin: Skin is warm and dry. No rash noted.  Psychiatric: He has a normal mood and affect.     ED Treatments / Results  Labs (all labs ordered are listed, but only abnormal results are displayed) Labs Reviewed  GROUP A STREP BY PCR    EKG None  Radiology No results found.  Procedures Procedures (including critical care time)  Medications Ordered in ED Medications  lidocaine (XYLOCAINE) 2 % viscous mouth solution 15 mL (15 mLs Mouth/Throat Given 01/04/18 1438)  ibuprofen (ADVIL,MOTRIN) tablet 800 mg (800 mg Oral Given 01/04/18 1439)     Initial Impression / Assessment and Plan / ED Course  I have reviewed the triage vital signs and the nursing notes.  Pertinent labs & imaging results that were available during my care of the patient were reviewed by me and considered in my medical decision making (see chart for details).     Strep negative, suspect viral pharyngitis.  Pt given ibuprofen and oral lidocaine here with improved sx, tolerated PO intake here.  No peritonsillar abscess. No respiratory distress. Discussed home tx plan and return precautions.   Final Clinical Impressions(s) / ED Diagnoses   Final diagnoses:  Viral pharyngitis    ED Discharge Orders         Ordered    ibuprofen (ADVIL,MOTRIN) 800 MG tablet  Every 8 hours PRN     01/04/18 1623    magic mouthwash w/lidocaine SOLN  4 times daily PRN     01/04/18 1623           Burgess Amordol, Virjean Boman, PA-C 01/05/18 [id number]    Bethann BerkshireZammit, Joseph, MD 01/05/18 [phone]

## 2018-01-04 NOTE — ED Triage Notes (Signed)
Pt reports sore throat and chills since yesterday.

## 2018-01-08 ENCOUNTER — Inpatient Hospital Stay (HOSPITAL_COMMUNITY)
Admission: EM | Admit: 2018-01-08 | Discharge: 2018-01-20 | DRG: 308 | Disposition: E | Payer: No Typology Code available for payment source | Attending: Family Medicine | Admitting: Family Medicine

## 2018-01-08 ENCOUNTER — Other Ambulatory Visit: Payer: Self-pay

## 2018-01-08 ENCOUNTER — Emergency Department (HOSPITAL_COMMUNITY): Payer: No Typology Code available for payment source

## 2018-01-08 ENCOUNTER — Encounter (HOSPITAL_COMMUNITY): Payer: Self-pay | Admitting: Emergency Medicine

## 2018-01-08 DIAGNOSIS — Z79899 Other long term (current) drug therapy: Secondary | ICD-10-CM | POA: Diagnosis not present

## 2018-01-08 DIAGNOSIS — E872 Acidosis: Secondary | ICD-10-CM | POA: Diagnosis present

## 2018-01-08 DIAGNOSIS — R945 Abnormal results of liver function studies: Secondary | ICD-10-CM | POA: Diagnosis not present

## 2018-01-08 DIAGNOSIS — N179 Acute kidney failure, unspecified: Secondary | ICD-10-CM | POA: Diagnosis present

## 2018-01-08 DIAGNOSIS — G4733 Obstructive sleep apnea (adult) (pediatric): Secondary | ICD-10-CM | POA: Diagnosis present

## 2018-01-08 DIAGNOSIS — J9601 Acute respiratory failure with hypoxia: Secondary | ICD-10-CM | POA: Diagnosis present

## 2018-01-08 DIAGNOSIS — I469 Cardiac arrest, cause unspecified: Secondary | ICD-10-CM | POA: Diagnosis not present

## 2018-01-08 DIAGNOSIS — J309 Allergic rhinitis, unspecified: Secondary | ICD-10-CM | POA: Diagnosis present

## 2018-01-08 DIAGNOSIS — F17210 Nicotine dependence, cigarettes, uncomplicated: Secondary | ICD-10-CM | POA: Diagnosis present

## 2018-01-08 DIAGNOSIS — R17 Unspecified jaundice: Secondary | ICD-10-CM | POA: Diagnosis present

## 2018-01-08 DIAGNOSIS — Z6841 Body Mass Index (BMI) 40.0 and over, adult: Secondary | ICD-10-CM | POA: Diagnosis not present

## 2018-01-08 DIAGNOSIS — D65 Disseminated intravascular coagulation [defibrination syndrome]: Secondary | ICD-10-CM | POA: Diagnosis present

## 2018-01-08 DIAGNOSIS — I959 Hypotension, unspecified: Secondary | ICD-10-CM | POA: Diagnosis present

## 2018-01-08 DIAGNOSIS — I471 Supraventricular tachycardia: Secondary | ICD-10-CM | POA: Diagnosis present

## 2018-01-08 DIAGNOSIS — E876 Hypokalemia: Secondary | ICD-10-CM | POA: Diagnosis present

## 2018-01-08 DIAGNOSIS — J810 Acute pulmonary edema: Secondary | ICD-10-CM | POA: Diagnosis present

## 2018-01-08 DIAGNOSIS — Z789 Other specified health status: Secondary | ICD-10-CM

## 2018-01-08 DIAGNOSIS — I4891 Unspecified atrial fibrillation: Secondary | ICD-10-CM | POA: Diagnosis present

## 2018-01-08 DIAGNOSIS — R0602 Shortness of breath: Secondary | ICD-10-CM

## 2018-01-08 DIAGNOSIS — K831 Obstruction of bile duct: Secondary | ICD-10-CM | POA: Diagnosis present

## 2018-01-08 DIAGNOSIS — R7989 Other specified abnormal findings of blood chemistry: Secondary | ICD-10-CM

## 2018-01-08 LAB — BASIC METABOLIC PANEL
Anion gap: 15 (ref 5–15)
BUN: 41 mg/dL — AB (ref 6–20)
CHLORIDE: 101 mmol/L (ref 98–111)
CO2: 17 mmol/L — ABNORMAL LOW (ref 22–32)
CREATININE: 2.41 mg/dL — AB (ref 0.61–1.24)
Calcium: 7.7 mg/dL — ABNORMAL LOW (ref 8.9–10.3)
GFR calc Af Amer: 40 mL/min — ABNORMAL LOW (ref >60)
GFR calc non Af Amer: 35 mL/min — ABNORMAL LOW (ref >60)
Glucose, Bld: 111 mg/dL — ABNORMAL HIGH (ref 70–99)
Potassium: 3.1 mmol/L — ABNORMAL LOW (ref 3.5–5.1)
SODIUM: 133 mmol/L — AB (ref 135–145)

## 2018-01-08 LAB — DIC (DISSEMINATED INTRAVASCULAR COAGULATION)PANEL
INR: 1.2
Smear Review: NONE SEEN
aPTT: 30 seconds (ref 24–36)

## 2018-01-08 LAB — URINALYSIS, ROUTINE W REFLEX MICROSCOPIC
Glucose, UA: 50 mg/dL — AB
Ketones, ur: NEGATIVE mg/dL
Leukocytes, UA: NEGATIVE
Nitrite: NEGATIVE
PROTEIN: 100 mg/dL — AB
Specific Gravity, Urine: 1.02 (ref 1.005–1.030)
pH: 5 (ref 5.0–8.0)

## 2018-01-08 LAB — CBC WITH DIFFERENTIAL/PLATELET
Basophils Absolute: 0 10*3/uL (ref 0.0–0.1)
Basophils Relative: 0 %
Eosinophils Absolute: 0 10*3/uL (ref 0.0–0.7)
Eosinophils Relative: 0 %
HCT: 43.3 % (ref 39.0–52.0)
Hemoglobin: 15.3 g/dL (ref 13.0–17.0)
LYMPHS ABS: 0.4 10*3/uL — AB (ref 0.7–4.0)
LYMPHS PCT: 9 %
MCH: 29.8 pg (ref 26.0–34.0)
MCHC: 35.3 g/dL (ref 30.0–36.0)
MCV: 84.4 fL (ref 78.0–100.0)
MONOS PCT: 2 %
Monocytes Absolute: 0.1 10*3/uL (ref 0.1–1.0)
Neutro Abs: 4.4 10*3/uL (ref 1.7–7.7)
Neutrophils Relative %: 89 %
PLATELETS: 49 10*3/uL — AB (ref 150–400)
RBC: 5.13 MIL/uL (ref 4.22–5.81)
RDW: 13.7 % (ref 11.5–15.5)
WBC: 5 10*3/uL (ref 4.0–10.5)

## 2018-01-08 LAB — COMPREHENSIVE METABOLIC PANEL
ALK PHOS: 146 U/L — AB (ref 38–126)
ALT: 43 U/L (ref 0–44)
AST: 62 U/L — ABNORMAL HIGH (ref 15–41)
Albumin: 2.8 g/dL — ABNORMAL LOW (ref 3.5–5.0)
Anion gap: 20 — ABNORMAL HIGH (ref 5–15)
BUN: 40 mg/dL — ABNORMAL HIGH (ref 6–20)
CALCIUM: 8.8 mg/dL — AB (ref 8.9–10.3)
CHLORIDE: 98 mmol/L (ref 98–111)
CO2: 18 mmol/L — ABNORMAL LOW (ref 22–32)
Creatinine, Ser: 2.61 mg/dL — ABNORMAL HIGH (ref 0.61–1.24)
GFR, EST AFRICAN AMERICAN: 36 mL/min — AB (ref >60)
GFR, EST NON AFRICAN AMERICAN: 31 mL/min — AB (ref >60)
Glucose, Bld: 97 mg/dL (ref 70–99)
Potassium: 2.5 mmol/L — CL (ref 3.5–5.1)
Sodium: 136 mmol/L (ref 135–145)
Total Bilirubin: 10.3 mg/dL — ABNORMAL HIGH (ref 0.3–1.2)
Total Protein: 6.8 g/dL (ref 6.5–8.1)

## 2018-01-08 LAB — LACTIC ACID, PLASMA
LACTIC ACID, VENOUS: 5.9 mmol/L — AB (ref 0.5–1.9)
Lactic Acid, Venous: 4.1 mmol/L (ref 0.5–1.9)

## 2018-01-08 LAB — RAPID URINE DRUG SCREEN, HOSP PERFORMED
Amphetamines: NOT DETECTED
Barbiturates: NOT DETECTED
Benzodiazepines: NOT DETECTED
COCAINE: NOT DETECTED
OPIATES: NOT DETECTED
Tetrahydrocannabinol: POSITIVE — AB

## 2018-01-08 LAB — TROPONIN I: TROPONIN I: 0.03 ng/mL — AB (ref <0.03)

## 2018-01-08 LAB — DIC (DISSEMINATED INTRAVASCULAR COAGULATION) PANEL
D DIMER QUANT: 7.68 ug{FEU}/mL — AB (ref 0.00–0.50)
PLATELETS: 43 10*3/uL — AB (ref 150–400)
PROTHROMBIN TIME: 15.1 s (ref 11.4–15.2)

## 2018-01-08 LAB — TSH: TSH: 3.082 u[IU]/mL (ref 0.350–4.500)

## 2018-01-08 LAB — MAGNESIUM: Magnesium: 2.2 mg/dL (ref 1.7–2.4)

## 2018-01-08 MED ORDER — ACETAMINOPHEN 325 MG PO TABS: 650.0000 mg | ORAL_TABLET | Freq: Four times a day (QID) | ORAL | Status: DC | PRN

## 2018-01-08 MED ORDER — AMIODARONE HCL IN DEXTROSE 360-4.14 MG/200ML-% IV SOLN
60.0000 mg/h | INTRAVENOUS | Status: AC
  Administered 2018-01-08: 60 mg/h via INTRAVENOUS
  Filled 2018-01-08: qty 200

## 2018-01-08 MED ORDER — DILTIAZEM LOAD VIA INFUSION
20.0000 mg | Freq: Once | INTRAVENOUS | Status: AC
  Administered 2018-01-08: 20 mg via INTRAVENOUS

## 2018-01-08 MED ORDER — POTASSIUM CHLORIDE 10 MEQ/100ML IV SOLN
10.0000 meq | Freq: Once | INTRAVENOUS | Status: AC
  Administered 2018-01-08: 10 meq via INTRAVENOUS
  Filled 2018-01-08: qty 100

## 2018-01-08 MED ORDER — SODIUM CHLORIDE 0.9 % IV SOLN
INTRAVENOUS | Status: DC
  Administered 2018-01-08 – 2018-01-09 (×3): via INTRAVENOUS

## 2018-01-08 MED ORDER — ONDANSETRON HCL 4 MG/2ML IJ SOLN: 4.0000 mg | Freq: Four times a day (QID) | INTRAMUSCULAR | Status: DC | PRN

## 2018-01-08 MED ORDER — LORAZEPAM 2 MG/ML IJ SOLN
INTRAMUSCULAR | Status: AC
  Administered 2018-01-08: 1 mg via INTRAVENOUS
  Filled 2018-01-08: qty 1

## 2018-01-08 MED ORDER — ADENOSINE 6 MG/2ML IV SOLN
6.0000 mg | Freq: Once | INTRAVENOUS | Status: AC
  Administered 2018-01-08: 6 mg via INTRAVENOUS

## 2018-01-08 MED ORDER — POLYETHYLENE GLYCOL 3350 17 G PO PACK: 17.0000 g | PACK | Freq: Every day | ORAL | Status: DC | PRN

## 2018-01-08 MED ORDER — SODIUM CHLORIDE 0.9 % IV BOLUS: 1000.0000 mL | Freq: Once | INTRAVENOUS | Status: DC

## 2018-01-08 MED ORDER — SODIUM CHLORIDE 0.9 % IV BOLUS
1000.0000 mL | Freq: Once | INTRAVENOUS | Status: AC
  Administered 2018-01-08: 1000 mL via INTRAVENOUS

## 2018-01-08 MED ORDER — POTASSIUM CHLORIDE CRYS ER 20 MEQ PO TBCR
40.0000 meq | EXTENDED_RELEASE_TABLET | Freq: Once | ORAL | Status: AC
  Administered 2018-01-08: 40 meq via ORAL
  Filled 2018-01-08: qty 2

## 2018-01-08 MED ORDER — DILTIAZEM HCL-DEXTROSE 100-5 MG/100ML-% IV SOLN (PREMIX)
5.0000 mg/h | INTRAVENOUS | Status: DC
Start: 2018-01-08 — End: 2018-01-09
  Administered 2018-01-08: 5 mg/h via INTRAVENOUS

## 2018-01-08 MED ORDER — SODIUM CHLORIDE 0.9 % IV BOLUS
2000.0000 mL | Freq: Once | INTRAVENOUS | Status: AC
  Administered 2018-01-08: 2000 mL via INTRAVENOUS

## 2018-01-08 MED ORDER — ONDANSETRON HCL 4 MG PO TABS: 4.0000 mg | ORAL_TABLET | Freq: Four times a day (QID) | ORAL | Status: DC | PRN

## 2018-01-08 MED ORDER — HEPARIN SODIUM (PORCINE) 5000 UNIT/ML IJ SOLN: 5000.0000 [IU] | Freq: Three times a day (TID) | INTRAMUSCULAR | Status: DC

## 2018-01-08 MED ORDER — METOPROLOL TARTRATE 25 MG PO TABS
25.0000 mg | ORAL_TABLET | Freq: Three times a day (TID) | ORAL | Status: DC
  Administered 2018-01-08: 25 mg via ORAL
  Filled 2018-01-08: qty 1

## 2018-01-08 MED ORDER — LIDOCAINE VISCOUS HCL 2 % MT SOLN
5.0000 mL | Freq: Four times a day (QID) | OROMUCOSAL | Status: DC | PRN
  Administered 2018-01-08 – 2018-01-09 (×3): 5 mL via OROMUCOSAL
  Filled 2018-01-08 (×4): qty 15

## 2018-01-08 MED ORDER — PHENOL 1.4 % MT LIQD
1.0000 | OROMUCOSAL | Status: DC | PRN
  Filled 2018-01-08: qty 177

## 2018-01-08 MED ORDER — AMIODARONE HCL IN DEXTROSE 360-4.14 MG/200ML-% IV SOLN
30.0000 mg/h | INTRAVENOUS | Status: DC
  Administered 2018-01-08: 59.94 mg/h via INTRAVENOUS
  Filled 2018-01-08: qty 200

## 2018-01-08 MED ORDER — POTASSIUM CHLORIDE 10 MEQ/100ML IV SOLN
10.0000 meq | Freq: Once | INTRAVENOUS | Status: AC
  Administered 2018-01-08: 10 meq via INTRAVENOUS

## 2018-01-08 MED ORDER — TRAZODONE HCL 50 MG PO TABS
50.0000 mg | ORAL_TABLET | Freq: Every evening | ORAL | Status: DC | PRN
  Administered 2018-01-08: 50 mg via ORAL
  Filled 2018-01-08: qty 1

## 2018-01-08 MED ORDER — METOPROLOL TARTRATE 5 MG/5ML IV SOLN
5.0000 mg | Freq: Once | INTRAVENOUS | Status: AC
  Administered 2018-01-08: 5 mg via INTRAVENOUS
  Filled 2018-01-08: qty 5

## 2018-01-08 MED ORDER — SODIUM CHLORIDE 0.9% FLUSH
3.0000 mL | Freq: Two times a day (BID) | INTRAVENOUS | Status: DC
  Administered 2018-01-08 – 2018-01-09 (×3): 3 mL via INTRAVENOUS

## 2018-01-08 MED ORDER — SODIUM CHLORIDE 0.9 % IV SOLN: 250.0000 mL | INTRAVENOUS | Status: DC | PRN

## 2018-01-08 MED ORDER — SODIUM CHLORIDE 0.9% FLUSH: 3.0000 mL | INTRAVENOUS | Status: DC | PRN

## 2018-01-08 MED ORDER — DIPHENHYDRAMINE HCL 25 MG PO CAPS: 25.0000 mg | ORAL_CAPSULE | ORAL | Status: DC | PRN

## 2018-01-08 MED ORDER — ADENOSINE 6 MG/2ML IV SOLN
12.0000 mg | Freq: Once | INTRAVENOUS | Status: AC
  Administered 2018-01-08: 12 mg via INTRAVENOUS

## 2018-01-08 MED ORDER — ALBUTEROL SULFATE (2.5 MG/3ML) 0.083% IN NEBU: 2.5000 mg | INHALATION_SOLUTION | RESPIRATORY_TRACT | Status: DC | PRN

## 2018-01-08 MED ORDER — MAGIC MOUTHWASH W/LIDOCAINE: 10.0000 mL | Freq: Four times a day (QID) | ORAL | Status: DC | PRN

## 2018-01-08 MED ORDER — ACETAMINOPHEN 650 MG RE SUPP: 650.0000 mg | Freq: Four times a day (QID) | RECTAL | Status: DC | PRN

## 2018-01-08 MED ORDER — SODIUM CHLORIDE 0.9 % IV SOLN
INTRAVENOUS | Status: DC
  Administered 2018-01-08: 13:00:00 via INTRAVENOUS

## 2018-01-08 MED ORDER — DIPHENHYDRAMINE HCL 25 MG PO CAPS
50.0000 mg | ORAL_CAPSULE | Freq: Once | ORAL | Status: AC
  Administered 2018-01-08: 50 mg via ORAL
  Filled 2018-01-08: qty 2

## 2018-01-08 MED ORDER — ADENOSINE 6 MG/2ML IV SOLN
INTRAVENOUS | Status: AC
  Filled 2018-01-08: qty 6

## 2018-01-08 MED ORDER — ADENOSINE 6 MG/2ML IV SOLN
INTRAVENOUS | Status: AC
  Filled 2018-01-08: qty 2

## 2018-01-08 MED ORDER — AMIODARONE LOAD VIA INFUSION
150.0000 mg | Freq: Once | INTRAVENOUS | Status: AC
  Administered 2018-01-08: 150 mg via INTRAVENOUS
  Filled 2018-01-08: qty 83.34

## 2018-01-08 MED ORDER — LORAZEPAM 2 MG/ML IJ SOLN
1.0000 mg | Freq: Once | INTRAMUSCULAR | Status: AC
  Administered 2018-01-08: 1 mg via INTRAVENOUS

## 2018-01-08 MED ORDER — MAGIC MOUTHWASH
5.0000 mL | Freq: Four times a day (QID) | ORAL | Status: DC | PRN
  Administered 2018-01-08 – 2018-01-09 (×3): 5 mL via ORAL
  Filled 2018-01-08 (×3): qty 5

## 2018-01-08 NOTE — ED Triage Notes (Signed)
Per ems pt SVT, diaphoretic. Pt is alert and oreinted at present time.

## 2018-01-08 NOTE — Progress Notes (Signed)
Upon entering room patient is found to have taken O2 off. When explained to the patient he needed to wear it for precautionary reasons, he states "I dont need that". Explained to patient benefits, risks, and that the doctor wants him to have it on he took it off and threw it in the floor.

## 2018-01-08 NOTE — Progress Notes (Signed)
Education provided on need for SCD's and TED hose. Pt states he does not want the SCD's, but is agreeable to TED hose. States he doesn't like wearing sock to sleep, so will want to take TED hose off around bed time.

## 2018-01-08 NOTE — Progress Notes (Signed)
Per verbal order from Dr. Wyline MoodBranch, titrate Cardizem to 5mg /hr before giving amiodarone bolus. If HR is adequately controlled with amiodarone, turn off Cardizem gtt.

## 2018-01-08 NOTE — ED Notes (Signed)
CRITICAL VALUE ALERT  Critical Value:  Lactic acid 5.9  Date & Time Notied:  01/12/2018, 1315  Provider Notified: Dr. Deretha EmoryZackowski  Orders Received/Actions taken:   See chart

## 2018-01-08 NOTE — H&P (Signed)
Patient Demographics:    Jesus Short, is a 27 y.o. 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 Metoprolol, cardiology consult appreciated, get echocardiogram when rate control is better, TSH was 3.0 with a magnesium of 2.2 however potassium was low at 2.5, replace potassium.  Continue amiodarone load per cardiology, wean off IV Cardizem per cardiology, continue p.o. metoprolol 25 mg every 8 hours.  CHA2DS2- VASc score   is = Zero,  Which is  equal to = 0.2 % annual risk of stroke, avoid aspirin due to low platelet count ,  full anticoagulation is NOT  Indicated.    2)Morbid Obesity--- this complicates overall care  3)AKI----acute kidney injury due to decreased oral intake , hypotension in the setting of A. fib with RVR with decreased renal perfusion, compounded by ibuprofen use in a patient with recent URI/viral syndrome,   creatinine on admission= 2.61  ,   baseline creatinine = according to patient and his mother previously normal , renally adjust medications, avoid nephrotoxic agents/dehydration/hypotension  4)Elevated LFTs and low platelets--- DIC panel pending, ???  Consider liver ultrasound, LFTs may improve with improve perfusion, check for hepatitis profile, patient denies any ingection of toxins  5)Tobacco Abuse-- Smoking  cessation counseling for 4 minutes today, consider nicotine patch   With History of - Reviewed by me  PMH--- Morbid obesity  Past Surgical History:  Procedure Laterality Date  . HIP SURGERY      Chief Complaint  Patient presents with  . Tachycardia      HPI:    Jesus Short  is a 27 y.o. male with past medical history relevant for tobacco abuse and morbid obesity who presents to the ED by EMS with rapid heart rate, at the scene EMS thought patient was in SVT with heart rate around 240 bpm.  Patient had dizziness, shortness of breath and palpitations, Additional history obtained from patient's mother at bedside Patient has been ill for the last several days was seen in the ED on 01/04/2018 with viral syndrome strep throat test was negative at the time.....Marland Kitchen oral intake has been poor due to sore throat  Patient tells me he has not voided since before midnight on 01/07/2018,   In ED... He is found to have a tachyarrhythmia with heart rate initially close to 200, he received IV adenosine 6 mg followed by another dose of IV adenosine 12 mg his  rate slowed down a bit the underlying rhythm looks more like atrial fibrillation  Blood pressures were soft remain tachycardic was started on IV Cardizem drip heart rate remained between 160-180, cardiology consulted, cardiologist advised amiodarone with IV amiodarone bolus heart rate is now finally below 140 bpm  In ED--- chest x-ray without acute findings, lactic acid is elevated at 5.9, TSH is normal at 3.0, troponin borderline at 0.03 with a potassium of 2.5 mag is normal at 2.2 creatinine is up to 2.61, BUN is 40 patient admits to ibuprofen use and decreased oral intake due to feeling unwell over the last several days Patient denies prior kidney problems  In ED--- LFTs are found to be elevated specifically total bili is 10.3 with AST of 62 and ALT of 43, alk phos is 146 bicarb is low at 18 with anion gap of 20,  patient denies any ingection of  toxins  Platelet count is down to 49, DIC panel pending, no evidence of active bleeding, suspect some degree of viral syndrome, however hemoglobin is normal at 15.3 and white count is normal at 5  Again HR  improved with IV fluids, IV amiodarone bolus IV Cardizem IV adenosine and p.o. metoprolol   Review of systems:    In addition to the HPI above,   A full Review of  Systems was done, all other systems reviewed are negative except as noted above in HPI , .   Social History:  Reviewed by me    Social History   Tobacco Use  . Smoking status: Current Some Day Smoker    Packs/day: 1.00    Types: Cigarettes  . Smokeless tobacco: Never Used  Substance Use Topics  . Alcohol use: Yes    Comment: occasional      Family History :  Reviewed by me  HTN   Home Medications:   Prior to Admission medications   Medication Sig Start Date End Date Taking? Authorizing Provider  albuterol (PROVENTIL HFA;VENTOLIN HFA) 108 (90 Base) MCG/ACT inhaler Inhale 2 puffs into the lungs every 4 (four) hours as needed for shortness of breath (cough). Patient not taking: Reported on 01/17/2018 06/07/15   Horton, Barbette Hair, MD  cyclobenzaprine (FLEXERIL) 10 MG tablet Take 1 tablet (10 mg total) by mouth 3 (three) times daily. Patient not taking: Reported on 01/09/2018 01/04/17   Lily Kocher, PA-C  ibuprofen (ADVIL,MOTRIN) 800 MG tablet Take 1 tablet (800 mg total) by mouth every 8 (eight) hours as needed for fever or moderate pain. Patient not taking: Reported on 12/24/2017 01/04/18   Evalee Jefferson, PA-C  magic mouthwash w/lidocaine SOLN Take 10 mLs by mouth 4 (four) times daily as needed (throat pain.  Gargle and spit medication). Note to pharmacy - equal parts diphendydramine, aluminum hydroxide and lidocaine HCL Patient not taking: Reported on 01/19/2018 01/04/18   Evalee Jefferson, PA-C  triamcinolone cream (KENALOG) 0.1 % Apply 1 application topically 2 (two) times daily. Patient not taking: Reported on  12/21/2017 07/13/13   Lily Kocher, PA-C     Allergies:    No Known Allergies   Physical Exam:   Vitals  Blood pressure (!) 86/57, pulse (!) 115, temperature 97.8 F (36.6 C), temperature source Oral, resp. rate (!) 38, height 5' 7"  (1.702 m), weight (!) 137 kg, SpO2 100 %.  Physical Examination: General appearance - alert, obese appearing, and in no distress  Mental status - alert, oriented to person, place, and time, = Eyes - sclera icterus Neck - supple, no  JVD elevation , Chest - clear  to auscultation bilaterally, symmetrical air movement,  Heart - S1 and S2 normal, irregularly irregular heart rate 160s to 180s the time of my initial evaluation Abdomen - soft, nontender, nondistended, no masses or organomegaly Neurological - screening mental status exam normal, neck supple without rigidity, cranial nerves II through XII intact, DTR's normal and symmetric Extremities - no pedal edema noted, intact peripheral pulses  Skin - warm, dry     Data Review:    CBC Recent Labs  Lab 01/09/2018 1144  WBC 5.0  HGB 15.3  HCT 43.3  PLT 49*  MCV 84.4  MCH 29.8  MCHC 35.3  RDW 13.7  LYMPHSABS 0.4*  MONOABS 0.1  EOSABS 0.0  BASOSABS 0.0   ------------------------------------------------------------------------------------------------------------------  Chemistries  Recent Labs  Lab 01/11/2018 1144  NA 136  K 2.5*  CL 98  CO2 18*  GLUCOSE 97  BUN 40*  CREATININE 2.61*  CALCIUM 8.8*  MG 2.2  AST 62*  ALT 43  ALKPHOS 146*  BILITOT 10.3*   ------------------------------------------------------------------------------------------------------------------ estimated creatinine clearance is 55.8 mL/min (A) (by C-G formula based on SCr of 2.61 mg/dL (H)). ------------------------------------------------------------------------------------------------------------------ Recent Labs    01/04/2018 1144  TSH 3.082   Coagulation profile No results for input(s): INR, PROTIME in  the last 168 hours. ------------------------------------------------------------------------------------------------------------------- No results for input(s): DDIMER in the last 72 hours. -------------------------------------------------------------------------------------------------------------------  Cardiac Enzymes Recent Labs  Lab 01/16/2018 1144  TROPONINI 0.03*   ------------------------------------------------------------------------------------------------------------------ No results found for: BNP  --------------------------------------------------------------------------------------------------------------  Urinalysis    Component Value Date/Time   COLORURINE AMBER (A) 12/25/2017 1101   APPEARANCEUR CLOUDY (A) 01/15/2018 1101   LABSPEC 1.020 12/30/2017 1101   PHURINE 5.0 12/30/2017 1101   GLUCOSEU 50 (A) 12/21/2017 1101   HGBUR SMALL (A) 12/30/2017 1101   BILIRUBINUR MODERATE (A) 01/19/2018 1101   KETONESUR NEGATIVE 01/04/2018 1101   PROTEINUR 100 (A) 01/11/2018 1101   NITRITE NEGATIVE 01/09/2018 1101   LEUKOCYTESUR NEGATIVE 01/07/2018 1101    ----------------------------------------------------------------------------------------------------------------   Imaging Results:    Dg Chest Port 1 View  Result Date: 01/17/2018 CLINICAL DATA:  Tachycardia. EXAM: PORTABLE CHEST 1 VIEW COMPARISON:  06/07/2015. FINDINGS: Normal heart size. Clear lung fields. Elevated RIGHT hemidiaphragm uncertain significance, not present previously. No effusion or pneumothorax. Bones unremarkable. IMPRESSION: Elevated RIGHT hemidiaphragm.  No visible consolidation or edema. Electronically Signed   By: Staci Righter M.D.   On: 12/25/2017 11:16    Radiological Exams on Admission: Dg Chest Port 1 View  Result Date: 12/24/2017 CLINICAL DATA:  Tachycardia. EXAM: PORTABLE CHEST 1 VIEW COMPARISON:  06/07/2015. FINDINGS: Normal heart size. Clear lung fields. Elevated RIGHT hemidiaphragm  uncertain significance, not present previously. No effusion or pneumothorax. Bones unremarkable. IMPRESSION: Elevated RIGHT hemidiaphragm.  No visible consolidation or edema. Electronically Signed   By: Staci Righter M.D.   On: 12/24/2017 11:16    DVT Prophylaxis -SCD (low platelets) AM Labs Ordered, also please review Full Orders  Family Communication: Admission, patients condition and plan of care including tests being ordered have been discussed with the patient and mother at bedside who indicate understanding and agree with the plan   Code Status - Full Code  Likely DC to  home  Condition   fair  Roxan Hockey M.D on 12/26/2017 at 5:15 PM Pager---[phone] Go to [email or web address] - [email or web address] for contact info  [facility] - Office  [phone]

## 2018-01-08 NOTE — Progress Notes (Signed)
Pt placed back on cardiac monitor, bp cuff reapplied, IV restarted. Pt continues to voice complaints about his agitation and irritability about having to wear monitor have IV access. Pt refuses SCDs. Very vocal about his unhappiness and "having to be tied down"

## 2018-01-08 NOTE — Consult Note (Signed)
Cardiology Consultation:   Patient ID: Jesus Short MRN: [id number]; DOB: [birth year]  Admit date: 12/30/2017 Date of Consult: 12/24/2017  Primary Care Provider: Patient, No Pcp Per Primary Cardiologist: n/a Primary Electrophysiologist:  None    Patient Profile:   Jesus Short is a 27 y.o. male with a hx of obesity who is being seen today for the evaluation of tachycardia at the request of Dr Joesph Fillers.  History of Present Illness:   Jesus Short 27 yo male history of obesity presents with palpitations. Reports over the last week generalized fatigue, subjective chills, poor oral intake and appetite. Seen in ER 01/04/18 with sore throat, diagnosed with viral pharyngitis. Hasnt' eaten or drank much due to the discomfort. Today had onset of significant palpitations at home. By EMS evaluate HRs>200s In ER given adensosine 59m then 182m slowed rate down transiently then resumed. In ER later received 80m59mV lopressor, 89m66m IV dilt, then started on dilt gtt at 10mg17m Rates remain elevated 160s, soft bp's though improving with IVFs.    UDS +THC, WBC 5, Hgb 15.3, Plt 49, Trop 0.01, tbili 10, TSH 3, lactic acid 5.9, Mg 2.2 CXR elevated right diaphragm    History reviewed. No pertinent past medical history.  Past Surgical History:  Procedure Laterality Date  . HIP SURGERY        Inpatient Medications: Scheduled Meds: . heparin  5,000 Units Subcutaneous Q8H  . metoprolol tartrate  25 mg Oral TID  . potassium chloride  40 mEq Oral Once  . potassium chloride  40 mEq Oral Once  . sodium chloride flush  3 mL Intravenous Q12H   Continuous Infusions: . sodium chloride    . sodium chloride    . diltiazem (CARDIZEM) infusion 10 mg/hr (12/23/2017 1052)  . potassium chloride 10 mEq (01/15/2018 1313)  . potassium chloride 10 mEq (01/09/2018 1254)  . sodium chloride     PRN Meds: sodium chloride, acetaminophen **OR** acetaminophen, albuterol, ondansetron **OR** ondansetron (ZOFRAN) IV,  polyethylene glycol, sodium chloride flush, traZODone  Allergies:   No Known Allergies  Social History:   Social History   Socioeconomic History  . Marital status: Single    Spouse name: Not on file  . Number of children: Not on file  . Years of education: Not on file  . Highest education level: Not on file  Occupational History  . Not on file  Social Needs  . Financial resource strain: Not on file  . Food insecurity:    Worry: Not on file    Inability: Not on file  . Transportation needs:    Medical: Not on file    Non-medical: Not on file  Tobacco Use  . Smoking status: Current Some Day Smoker    Packs/day: 0.25    Types: Cigarettes  . Smokeless tobacco: Never Used  Substance and Sexual Activity  . Alcohol use: Yes    Comment: occasional  . Drug use: No  . Sexual activity: Not on file  Lifestyle  . Physical activity:    Days per week: Not on file    Minutes per session: Not on file  . Stress: Not on file  Relationships  . Social connections:    Talks on phone: Not on file    Gets together: Not on file    Attends religious service: Not on file    Active member of club or organization: Not on file    Attends meetings of clubs or organizations: Not on file  Relationship status: Not on file  . Intimate partner violence:    Fear of current or ex partner: Not on file    Emotionally abused: Not on file    Physically abused: Not on file    Forced sexual activity: Not on file  Other Topics Concern  . Not on file  Social History Narrative  . Not on file    Family History:   History reviewed. No pertinent family history.   ROS:  Please see the history of present illness.  All other ROS reviewed and negative.     Physical Exam/Data:   Vitals:   01/15/2018 1029 01/11/2018 1038 12/30/2017 1044 01/13/2018 1314  BP:   (!) 144/120 (!) 84/57  Pulse: (!) 256 (!) 263 (!) 134 (!) 174  Temp:    97.7 F (36.5 C)  TempSrc:    Oral  SpO2: 99%  100%    No intake or output  data in the 24 hours ending 01/01/2018 1341 There were no vitals filed for this visit. There is no height or weight on file to calculate BMI.  General:  Well nourished, well developed, in no acute distress HEENT: normal Lymph: no adenopathy Neck: no JVD Endocrine:  No thryomegaly Vascular: No carotid bruits; FA pulses 2+ bilaterally without bruits  Cardiac:  normal S1, S2; RRR; no murmur Lungs:  clear to auscultation bilaterally, no wheezing, rhonchi or rales  Abd: soft, nontender, no hepatomegaly  Ext: no edema Musculoskeletal:  No deformities, BUE and BLE strength normal and equal Skin: warm and dry  Neuro:  CNs 2-12 intact, no focal abnormalities noted Psych:  Normal affect     Laboratory Data:  Chemistry Recent Labs  Lab 01/15/2018 1144  NA 136  K 2.5*  CL 98  CO2 18*  GLUCOSE 97  BUN 40*  CREATININE 2.61*  CALCIUM 8.8*  GFRNONAA 31*  GFRAA 36*  ANIONGAP 20*    Recent Labs  Lab 01/17/2018 1144  PROT 6.8  ALBUMIN 2.8*  AST 62*  ALT 43  ALKPHOS 146*  BILITOT 10.3*   Hematology Recent Labs  Lab 01/03/2018 1144  WBC 5.0  RBC 5.13  HGB 15.3  HCT 43.3  MCV 84.4  MCH 29.8  MCHC 35.3  RDW 13.7  PLT 49*   Cardiac Enzymes Recent Labs  Lab 01/05/2018 1144  TROPONINI 0.03*   No results for input(s): TROPIPOC in the last 168 hours.  BNPNo results for input(s): BNP, PROBNP in the last 168 hours.  DDimer No results for input(s): DDIMER in the last 168 hours.  Radiology/Studies:  Dg Chest Port 1 View  Result Date: 01/12/2018 CLINICAL DATA:  Tachycardia. EXAM: PORTABLE CHEST 1 VIEW COMPARISON:  06/07/2015. FINDINGS: Normal heart size. Clear lung fields. Elevated RIGHT hemidiaphragm uncertain significance, not present previously. No effusion or pneumothorax. Bones unremarkable. IMPRESSION: Elevated RIGHT hemidiaphragm.  No visible consolidation or edema. Electronically Signed   By: Staci Righter M.D.   On: 01/05/2018 11:16    Assessment and Plan:   1. Afib with  RVR - new diagnosis, in setting of systemic illness with significant AKI, hyperbilirubinemia, thrombocytopenia, lactic acidosis and hypokalemia - probably related to significant systemic illness. Severe rates despite aggressive av nodal dosing, bp's are soft.  - will start amiodarone with plans for short course, particularly given his young age. His tachycardia/arrhythmia drive should decrease as his systemic illness resolves and his K is repleted.  - CHADS2Vasc score is 0 based on available information. Given his low platelets would  also not antiocoagulate or use aspirin.  - obtain echo once better rate controlled.   2. Renal failure - unclear chronicity, no prior labs. Perhaps prerenal from poor oral intake.   3. Thrombocytopenia - unclear etiology. ?DIC panel, will defer to primary team.  4. Hyperbilirubinemia - t bili 10.3, alk phos 146, AST 62 - no abdominal symptoms - defer management to primary team.        For questions or updates, please contact [place] Please consult [email or web address] for contact info under     Signed, Carlyle Dolly, MD  01/09/2018 1:41 PM

## 2018-01-08 NOTE — Progress Notes (Signed)
Pt has taken O2 sensor off his finger. He refuses to let staff put another on. Told pt about the risks and dangers associated with afib, low oxygenation, and he still refuses to wear the O2 monitor. For this reason, O2 montior alarm is turned to off.

## 2018-01-08 NOTE — Progress Notes (Signed)
Per ED pt has had a total of 4L boluses. Informed pt and encouraged to try to urinate. Pt able to urinate 100 mL. Bladder scan performed only showed 40 mL retention.

## 2018-01-08 NOTE — ED Provider Notes (Addendum)
[facility] EMERGENCY DEPARTMENT Provider Note   CSN: [id number] Arrival date & time: 12/25/2017  1026     History   Chief Complaint Chief Complaint  Patient presents with  . Tachycardia    HPI Jesus Short is a 27 y.o. male.  Patient brought in by EMS for significant rapid heart rate.  They were getting what looked like an SVT with heart rate in the 250-280 range.  Patient states that he started feeling really bad around 9:00 this morning.  However he was seen September 15 for a sore throat with a negative strep test and has not felt quite right since then but felt really poorly yesterday to but did not feel as bad as he did today.  Did not notice that his heart rate was going significantly fast until today.  Symptoms associated with shortness of breath diaphoresis and chest discomfort.  Denied any chest discomfort yesterday.  No history of fast heart rate in the past.  Patient denied cocaine.  Denied any e-cigarette use.  Denied any recent marijuana use states that he has not felt well enough to use it for about a week.  States he is only been taking an over-the-counter cough medicine for the sore throat.     History reviewed. No pertinent past medical history.  Patient Active Problem List   Diagnosis Date Noted  . ALLERGIC RHINITIS 08/06/2006  . OBESITY, NOS 06/19/2006  . 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 CONSTIPATION 06/19/2006    Past Surgical History:  Procedure Laterality Date  . HIP SURGERY          Home Medications    Prior to Admission medications   Medication Sig Start Date End Date Taking? Authorizing Provider  albuterol (PROVENTIL HFA;VENTOLIN HFA) 108 (90 Base) MCG/ACT inhaler Inhale 2 puffs into the lungs every 4 (four) hours as needed for shortness of breath (cough). 06/07/15   Horton, Mayer Masker, MD  cyclobenzaprine (FLEXERIL) 10 MG tablet Take 1 tablet (10 mg total) by mouth 3 (three) times daily. 01/04/17   Ivery Quale, PA-C    ibuprofen (ADVIL,MOTRIN) 800 MG tablet Take 1 tablet (800 mg total) by mouth every 8 (eight) hours as needed for fever or moderate pain. 01/04/18   Burgess Amor, PA-C  magic mouthwash w/lidocaine SOLN Take 10 mLs by mouth 4 (four) times daily as needed (throat pain.  Gargle and spit medication). Note to pharmacy - equal parts diphendydramine, aluminum hydroxide and lidocaine HCL 01/04/18   Burgess Amor, PA-C  triamcinolone cream (KENALOG) 0.1 % Apply 1 application topically 2 (two) times daily. 07/13/13   Ivery Quale, PA-C    Family History History reviewed. No pertinent family history.  Social History Social History   Tobacco Use  . Smoking status: Current Some Day Smoker    Packs/day: 0.25    Types: Cigarettes  . Smokeless tobacco: Never Used  Substance Use Topics  . Alcohol use: Yes    Comment: occasional  . Drug use: No     Allergies   Patient has no known allergies.   Review of Systems Review of Systems  Constitutional: Positive for diaphoresis.  HENT: Positive for sore throat. Negative for congestion.   Eyes: Negative for visual disturbance.  Respiratory: Positive for shortness of breath. Negative for wheezing.   Cardiovascular: Positive for chest pain and palpitations. Negative for leg swelling.  Gastrointestinal: Negative for abdominal pain, nausea and vomiting.  Genitourinary: Negative for difficulty urinating, dysuria and hematuria.  Musculoskeletal: Negative for back  pain and myalgias.  Skin: Negative for rash.  Neurological: Negative for headaches.  Hematological: Does not bruise/bleed easily.  Psychiatric/Behavioral: Negative for confusion.     Physical Exam Updated Vital Signs BP (!) 144/120   Pulse (!) 134   SpO2 100%   Physical Exam  Constitutional: He is oriented to person, place, and time. He appears well-developed and well-nourished. He appears distressed.  HENT:  Head: Normocephalic and atraumatic.  Mouth/Throat: Oropharynx is clear and moist.   Eyes: Pupils are equal, round, and reactive to light. Conjunctivae and EOM are normal.  Cardiovascular:  Very rapid heart rate  Pulmonary/Chest: Effort normal. He has no wheezes. He has no rales.  Abdominal: Soft. Bowel sounds are normal.  Musculoskeletal: He exhibits no edema.  Neurological: He is alert and oriented to person, place, and time. No cranial nerve deficit or sensory deficit. He exhibits normal muscle tone. Coordination normal.  Skin: Skin is warm.  Nursing note and vitals reviewed.    ED Treatments / Results  Labs (all labs ordered are listed, but only abnormal results are displayed) Labs Reviewed  CBC WITH DIFFERENTIAL/PLATELET  COMPREHENSIVE METABOLIC PANEL  RAPID URINE DRUG SCREEN, HOSP PERFORMED  URINALYSIS, ROUTINE W REFLEX MICROSCOPIC  TROPONIN I  TSH    EKG None   Initial EKG SVT rapid heart rate low 200s.  Second EKG after improvement heart rate consistent with atrial fibrillation was still with a heart rate in the 170s.  Radiology Dg Chest Port 1 View  Result Date: 01/03/2018 CLINICAL DATA:  Tachycardia. EXAM: PORTABLE CHEST 1 VIEW COMPARISON:  06/07/2015. FINDINGS: Normal heart size. Clear lung fields. Elevated RIGHT hemidiaphragm uncertain significance, not present previously. No effusion or pneumothorax. Bones unremarkable. IMPRESSION: Elevated RIGHT hemidiaphragm.  No visible consolidation or edema. Electronically Signed   By: Elsie StainJohn T Curnes M.D.   On: 01/11/2018 11:16    Procedures Procedures (including critical care time) CRITICAL CARE Performed by: Vanetta MuldersScott Clovia Reine Total critical care time: 90 minutes Critical care time was exclusive of separately billable procedures and treating other patients. Critical care was necessary to treat or prevent imminent or life-threatening deterioration. Critical care was time spent personally by me on the following activities: development of treatment plan with patient and/or surrogate as well as nursing,  discussions with consultants, evaluation of patient's response to treatment, examination of patient, obtaining history from patient or surrogate, ordering and performing treatments and interventions, ordering and review of laboratory studies, ordering and review of radiographic studies, pulse oximetry and re-evaluation of patient's condition.   Medications Ordered in ED Medications  diltiazem (CARDIZEM) 1 mg/mL load via infusion 20 mg (20 mg Intravenous Bolus from Bag 01/07/2018 1043)    And  diltiazem (CARDIZEM) 100 mg in dextrose 5% 100mL (1 mg/mL) infusion (10 mg/hr Intravenous Rate/Dose Change 01/04/2018 1052)  0.9 %  sodium chloride infusion (has no administration in time range)  sodium chloride 0.9 % bolus 1,000 mL (has no administration in time range)  adenosine (ADENOCARD) 6 MG/2ML injection 6 mg (6 mg Intravenous Given 01/03/2018 1035)  adenosine (ADENOCARD) 6 MG/2ML injection 12 mg (12 mg Intravenous Given 01/09/2018 1037)  LORazepam (ATIVAN) injection 1 mg (1 mg Intravenous Given by Other 12/22/2017 1053)     Initial Impression / Assessment and Plan / ED Course  I have reviewed the triage vital signs and the nursing notes.  Pertinent labs & imaging results that were available during my care of the patient were reviewed by me and considered in my medical decision making (see  chart for details).    Patient upon presentation had narrow complex very rapid tachycardia.  Looked like SVT.  Rate well above 200 mostly 250-280 range.  Patient very diaphoretic patient anxious patient short of breath but his oxygen sats were fine.  Also patient with some anterior chest pain.  Due to the fact it was narrow complex.  Even though the rate was extremely fast gave him 6 mg of adenosine.  This gave a brief pause and then he was right back into the heart rate up to 250s.  Not able to see any underlying rhythm abnormality.  So patient received an additional dose 12 mg of adenosine this gave a longer pause.  And when  he restarted you could see that the underlying rhythm appeared to be atrial fibrillation.  However he did also go right back up into the 250 range.  Still narrow complex.  Patient's blood pressure okay.  Even though sometimes we had trouble getting it.  Was most likely probably around 130 most likely above 80.  So did start diltiazem.  Patient got 20 mg bolus this brought his heart rate down into the low 200s.  Patient started to feel better.  He was started on a drip initially at 5 and it was increased to 7.5 and then increased to 10.  Stayed there.  Discussed with cardiology Dr. Wyline Mood was covering for cards.  He agreed with our game plan so far.  At this point time we did not have any labs he recommended getting a thyroid-stimulating hormone as well.  He felt that it was very unusual to have such a quick heart rate.  Patient's review of old EKGs from several years ago was only one but it showed no evidence of any conduction delay or any evidence of a preexcitation type WPW.  Patient's only complaint of illness has been the sore throat.  And not feeling well before this.  Chest x-ray showed no significant findings.  Patient's heart rate on the diltiazem drip came down to the low 200s but never really got below 100.  Cardiology had recommended if pressures were tolerated to try a beta-blocker.  So patient was given IV Lopressor and this brought his heart rate down into the 160s 170s.  Also patient received 2 L of fluid.  Labs came back markedly abnormal but could be explained by the severe tachycardia and probably poor perfusion of his kidneys.  And perhaps a peripheral infusion of his liver and perhaps even his heart.  But once patient's heart rate was under control shortness of breath was controlled oxygen levels were good patient no longer had the chest pain.  Patient was no longer diaphoretic.  Patient showed evidence of significant acute kidney injury.  Had a markedly elevated lactic acid borderline  elevated troponin all these were expected.  But was unexpected was the patient's bilirubin was 10.  Other LFTs to start with were not significantly abnormal.  Discussed with the hospitalist.  They were planning to send him to [facility] in case he needs electrophysiologic studies but in discussion with Dr. Stephan Minister Dr. Wyline Mood wanted to keep the patient here to the hospitalist decided to admit the patient here.  Other than the lab abnormalities at the time of admission patient was markedly improved.  He had good blood pressures heart rate was in the 161 70-1 80 range.   Final Clinical Impressions(s) / ED Diagnoses   Final diagnoses:  Atrial fibrillation with RVR [facility])    ED  Discharge Orders    None       Vanetta Mulders, MD 01-16-2018 1722    Vanetta Mulders, MD January 16, 2018 1723

## 2018-01-08 NOTE — Progress Notes (Signed)
HR maintaining in 130's to 150's. BP soft at 86/57. Will keep amiodarone gtt running and D/C Cardizem. Pt currently has no complaints except not liking being hooked up to the monitor.

## 2018-01-08 NOTE — ED Notes (Addendum)
Pt received total of 6mg  first dose and 12mg  second dose. Slight pause in second dose, but returned to over 250 heart rate. Pt appears to have rapid atrial fib per EDP.

## 2018-01-08 NOTE — Progress Notes (Signed)
Pt has now gotten out of bed, went to the foot of the bed and is laying backwards in the bed with his head at the foot. He has removed his BP cuff yet again. He refuses to put it back on. At this time he has converted back into a ST rhythm. Again the importance of measuring his BP frequently is explained to him with him on the amiodarone. He refuses to put the bp cuff back on. MD notified.

## 2018-01-09 ENCOUNTER — Inpatient Hospital Stay (HOSPITAL_COMMUNITY): Payer: No Typology Code available for payment source

## 2018-01-09 ENCOUNTER — Encounter (HOSPITAL_COMMUNITY): Payer: Self-pay

## 2018-01-09 ENCOUNTER — Inpatient Hospital Stay (HOSPITAL_COMMUNITY): Payer: No Typology Code available for payment source | Admitting: Certified Registered Nurse Anesthetist

## 2018-01-09 DIAGNOSIS — N179 Acute kidney failure, unspecified: Secondary | ICD-10-CM

## 2018-01-09 DIAGNOSIS — R7989 Other specified abnormal findings of blood chemistry: Secondary | ICD-10-CM

## 2018-01-09 DIAGNOSIS — E876 Hypokalemia: Secondary | ICD-10-CM

## 2018-01-09 DIAGNOSIS — R945 Abnormal results of liver function studies: Secondary | ICD-10-CM

## 2018-01-09 LAB — HEMOGLOBIN A1C
Hgb A1c MFr Bld: 5.4 % (ref 4.8–5.6)
Mean Plasma Glucose: 108.28 mg/dL

## 2018-01-09 LAB — CBC
HEMATOCRIT: 35.4 % — AB (ref 39.0–52.0)
Hemoglobin: 12.1 g/dL — ABNORMAL LOW (ref 13.0–17.0)
MCH: 28.8 pg (ref 26.0–34.0)
MCHC: 34.2 g/dL (ref 30.0–36.0)
MCV: 84.3 fL (ref 78.0–100.0)
Platelets: 37 10*3/uL — ABNORMAL LOW (ref 150–400)
RBC: 4.2 MIL/uL — ABNORMAL LOW (ref 4.22–5.81)
RDW: 14.2 % (ref 11.5–15.5)
WBC: 11.7 10*3/uL — ABNORMAL HIGH (ref 4.0–10.5)

## 2018-01-09 LAB — COMPREHENSIVE METABOLIC PANEL
ALBUMIN: 2.2 g/dL — AB (ref 3.5–5.0)
ALK PHOS: 175 U/L — AB (ref 38–126)
ALT: 54 U/L — ABNORMAL HIGH (ref 0–44)
ANION GAP: 13 (ref 5–15)
AST: 73 U/L — ABNORMAL HIGH (ref 15–41)
BILIRUBIN TOTAL: 12.1 mg/dL — AB (ref 0.3–1.2)
BUN: 46 mg/dL — ABNORMAL HIGH (ref 6–20)
CALCIUM: 7.8 mg/dL — AB (ref 8.9–10.3)
CO2: 16 mmol/L — ABNORMAL LOW (ref 22–32)
Chloride: 106 mmol/L (ref 98–111)
Creatinine, Ser: 2.03 mg/dL — ABNORMAL HIGH (ref 0.61–1.24)
GFR calc non Af Amer: 43 mL/min — ABNORMAL LOW (ref >60)
GFR, EST AFRICAN AMERICAN: 49 mL/min — AB (ref >60)
Glucose, Bld: 106 mg/dL — ABNORMAL HIGH (ref 70–99)
POTASSIUM: 4.1 mmol/L (ref 3.5–5.1)
Sodium: 135 mmol/L (ref 135–145)
TOTAL PROTEIN: 5.5 g/dL — AB (ref 6.5–8.1)

## 2018-01-09 LAB — LIPID PANEL
Cholesterol: 136 mg/dL (ref 0–200)
HDL: <10 mg/dL — ABNORMAL LOW (ref >40)
LDL Cholesterol: UNDETERMINED mg/dL (ref 0–99)
Triglycerides: 594 mg/dL — ABNORMAL HIGH (ref <150)
VLDL: UNDETERMINED mg/dL (ref 0–40)

## 2018-01-09 LAB — BLOOD GAS, ARTERIAL
Acid-base deficit: 9.4 mmol/L — ABNORMAL HIGH (ref 0.0–2.0)
Bicarbonate: 15.7 mmol/L — ABNORMAL LOW (ref 20.0–28.0)
DRAWN BY: 308601
Delivery systems: POSITIVE
Expiratory PAP: 6
FIO2: 60
Inspiratory PAP: 16
Mode: POSITIVE
O2 SAT: 95.4 %
PATIENT TEMPERATURE: 37
PCO2 ART: 33.1 mmHg (ref 32.0–48.0)
PH ART: 7.298 — AB (ref 7.350–7.450)
PO2 ART: 89.4 mmHg (ref 83.0–108.0)

## 2018-01-09 LAB — GLUCOSE, CAPILLARY: Glucose-Capillary: 96 mg/dL (ref 70–99)

## 2018-01-09 LAB — LACTIC ACID, PLASMA: Lactic Acid, Venous: 3 mmol/L (ref 0.5–1.9)

## 2018-01-09 LAB — RETICULOCYTES
RBC.: 4.09 MIL/uL — ABNORMAL LOW (ref 4.22–5.81)
Retic Count, Absolute: 8.2 10*3/uL — ABNORMAL LOW (ref 19.0–186.0)
Retic Ct Pct: 0.2 % — ABNORMAL LOW (ref 0.4–3.1)

## 2018-01-09 LAB — MRSA PCR SCREENING: MRSA BY PCR: POSITIVE — AB

## 2018-01-09 LAB — BILIRUBIN, DIRECT: Bilirubin, Direct: 7.6 mg/dL — ABNORMAL HIGH (ref 0.0–0.2)

## 2018-01-09 LAB — LACTATE DEHYDROGENASE: LDH: 292 U/L — AB (ref 98–192)

## 2018-01-09 LAB — SAVE SMEAR

## 2018-01-09 LAB — HIV ANTIBODY (ROUTINE TESTING W REFLEX): HIV Screen 4th Generation wRfx: NONREACTIVE

## 2018-01-09 MED ORDER — PROPOFOL 10 MG/ML IV BOLUS
INTRAVENOUS | Status: DC | PRN
  Administered 2018-01-09: 200 mg via INTRAVENOUS

## 2018-01-09 MED ORDER — MUPIROCIN 2 % EX OINT: 1.0000 "application " | TOPICAL_OINTMENT | Freq: Two times a day (BID) | CUTANEOUS | Status: DC

## 2018-01-09 MED ORDER — ORAL CARE MOUTH RINSE
15.0000 mL | Freq: Two times a day (BID) | OROMUCOSAL | Status: DC
  Administered 2018-01-09: 15 mL via OROMUCOSAL

## 2018-01-09 MED ORDER — MUPIROCIN 2 % EX OINT
1.0000 "application " | TOPICAL_OINTMENT | Freq: Two times a day (BID) | CUTANEOUS | Status: DC
  Administered 2018-01-09: 1 via NASAL
  Filled 2018-01-09: qty 22

## 2018-01-09 MED ORDER — LORAZEPAM 2 MG/ML IJ SOLN
1.0000 mg | Freq: Once | INTRAMUSCULAR | Status: AC
  Administered 2018-01-09: 1 mg via INTRAVENOUS
  Filled 2018-01-09: qty 1

## 2018-01-09 MED ORDER — DILTIAZEM HCL 25 MG/5ML IV SOLN
10.0000 mg | Freq: Once | INTRAVENOUS | Status: AC
  Administered 2018-01-09: 10 mg via INTRAVENOUS
  Filled 2018-01-09: qty 5

## 2018-01-09 MED ORDER — MIDAZOLAM HCL 2 MG/2ML IJ SOLN
2.0000 mg | Freq: Once | INTRAMUSCULAR | Status: AC
  Administered 2018-01-09: 2 mg via INTRAVENOUS

## 2018-01-09 MED ORDER — MIDAZOLAM HCL 2 MG/2ML IJ SOLN
INTRAMUSCULAR | Status: AC
  Administered 2018-01-09: 2 mg via INTRAVENOUS
  Filled 2018-01-09: qty 2

## 2018-01-09 MED ORDER — PROPOFOL 1000 MG/100ML IV EMUL
INTRAVENOUS | Status: AC
  Filled 2018-01-09: qty 100

## 2018-01-09 MED ORDER — PHENYLEPHRINE HCL-NACL 10-0.9 MG/250ML-% IV SOLN
0.0000 ug/min | INTRAVENOUS | Status: DC
  Administered 2018-01-09: 20 ug/min via INTRAVENOUS
  Filled 2018-01-09: qty 250

## 2018-01-09 MED ORDER — TECHNETIUM TC 99M DIETHYLENETRIAME-PENTAACETIC ACID
30.0000 | Freq: Once | INTRAVENOUS | Status: AC | PRN
  Administered 2018-01-09: 33 via INTRAVENOUS

## 2018-01-09 MED ORDER — CHLORHEXIDINE GLUCONATE CLOTH 2 % EX PADS
6.0000 | MEDICATED_PAD | Freq: Every day | CUTANEOUS | Status: DC
  Administered 2018-01-09: 6 via TOPICAL

## 2018-01-09 MED ORDER — TECHNETIUM TO 99M ALBUMIN AGGREGATED
4.0000 | Freq: Once | INTRAVENOUS | Status: AC | PRN
  Administered 2018-01-09: 4.4 via INTRAVENOUS

## 2018-01-09 MED ORDER — AMIODARONE HCL IN DEXTROSE 360-4.14 MG/200ML-% IV SOLN
INTRAVENOUS | Status: AC
  Filled 2018-01-09: qty 200

## 2018-01-09 MED ORDER — CISATRACURIUM BESYLATE 20 MG/10ML IV SOLN
10.0000 mg | Freq: Once | INTRAVENOUS | Status: AC
  Administered 2018-01-09: 10 mg via INTRAVENOUS
  Filled 2018-01-09: qty 10

## 2018-01-09 MED ORDER — FUROSEMIDE 10 MG/ML IJ SOLN
INTRAMUSCULAR | Status: AC
  Administered 2018-01-09: 40 mg via INTRAVENOUS
  Filled 2018-01-09: qty 4

## 2018-01-09 MED ORDER — SUCCINYLCHOLINE CHLORIDE 20 MG/ML IJ SOLN
INTRAMUSCULAR | Status: DC | PRN
  Administered 2018-01-09: 140 mg via INTRAVENOUS

## 2018-01-09 MED ORDER — CHLORHEXIDINE GLUCONATE CLOTH 2 % EX PADS: 6.0000 | MEDICATED_PAD | Freq: Every day | CUTANEOUS | Status: DC

## 2018-01-09 MED ORDER — MIDAZOLAM HCL 2 MG/2ML IJ SOLN
2.0000 mg | INTRAMUSCULAR | Status: DC | PRN
  Administered 2018-01-09: 2 mg via INTRAVENOUS

## 2018-01-09 MED ORDER — AMIODARONE IV BOLUS ONLY 150 MG/100ML
150.0000 mg | Freq: Once | INTRAVENOUS | Status: DC
  Filled 2018-01-09: qty 100

## 2018-01-09 MED ORDER — METOPROLOL TARTRATE 5 MG/5ML IV SOLN
5.0000 mg | INTRAVENOUS | Status: DC | PRN
  Administered 2018-01-09: 5 mg via INTRAVENOUS
  Filled 2018-01-09: qty 5

## 2018-01-09 MED ORDER — LIDOCAINE HCL (CARDIAC) PF 100 MG/5ML IV SOSY
PREFILLED_SYRINGE | INTRAVENOUS | Status: DC | PRN
  Administered 2018-01-09: 100 mg via INTRAVENOUS

## 2018-01-09 MED ORDER — PROPOFOL 1000 MG/100ML IV EMUL: 5.0000 ug/kg/min | INTRAVENOUS | Status: DC

## 2018-01-09 MED ORDER — FUROSEMIDE 10 MG/ML IJ SOLN
40.0000 mg | Freq: Once | INTRAMUSCULAR | Status: AC
  Administered 2018-01-09: 40 mg via INTRAVENOUS
  Filled 2018-01-09: qty 4

## 2018-01-09 MED ORDER — PHENYLEPHRINE HCL 10 MG/ML IJ SOLN
INTRAMUSCULAR | Status: DC | PRN
  Administered 2018-01-09 (×4): 120 ug via INTRAVENOUS

## 2018-01-09 MED ORDER — MIDAZOLAM HCL 2 MG/2ML IJ SOLN: 3.0000 mg | Freq: Once | INTRAMUSCULAR | Status: DC

## 2018-01-09 MED ORDER — FENTANYL BOLUS VIA INFUSION
50.0000 ug | INTRAVENOUS | Status: DC | PRN
  Filled 2018-01-09: qty 50

## 2018-01-09 MED ORDER — FENTANYL 2500MCG IN NS 250ML (10MCG/ML) PREMIX INFUSION
25.0000 ug/h | INTRAVENOUS | Status: DC
  Filled 2018-01-09: qty 250

## 2018-01-09 NOTE — Progress Notes (Signed)
Pt continues to refuse to wear bp cuff and o2 sensor. Has allowed periodic spot checks. RN continues to stress importance of compliance with protocols regarding bp measurements while on amiodarone. Pt still refuses.

## 2018-01-09 NOTE — Progress Notes (Signed)
Second echo attempt, HR too high.

## 2018-01-09 NOTE — Progress Notes (Signed)
Called to the bedside by RN at 1930 with concerns of dyspnea, tachycardia and anxiousness. On assessment, pt breathing 35-40 bpm, SOB, sat in high 80's on non-rebreather. Pt apparently noncompliant with bipap machine and highly anxious when RT attempts bipap treatment. Pt unable to tolerate Bipap treatment at this time CCM consulted for further management. Will sign off to CCM at this time.     Tachycardia -  HR 130- 140's. Given metoprolol and cardizem with no improvement in HR. Tachycardia most likely for increase WOB and fatigue.   SOB  - ABG, Stat CXR - Given ativan IV and attempted to place on Bipap. Pt unable to tolerate Bipap machine. CCM consulted. Pt intubated at bedside  Audrea MuscatXenia Byard Carranza, NP [facility] 7p-7a [phone]

## 2018-01-09 NOTE — Progress Notes (Signed)
eLink Physician-Brief Progress Note Patient Name: Enedina FinnerCorey L Yates DOB: 01-Jul-1990 MRN: [id number]   Date of Service  01/09/2018  HPI/Events of Note  Acute respiratory failure secondary to excessive work of breathing, with respiratory fatigue and impending apnea. Etiology of increased work of breathing not entirely clear given CXR shows low lung volumes and mild vascular congestion. Bilirubin is elevated raising possibility of hepatic encephalopathy driving the WOB, but his liver enzymes are only mildly elevated.  eICU Interventions  Anesthesia paged for stat intubation, check serum Ammonia level, pan-cultures + broad spectrum antibiotic coverage for possible evolving sepsis, serum Procalcitonin, mechanical ventilation        Okoron[place]wo U Ogan 01/09/2018, 10:37 PM

## 2018-01-09 NOTE — Progress Notes (Addendum)
Patient Demographics:    Jesus Short, is a 27 y.o. male, DOB - 1990/10/27, ZOX:[id number]  Admit date - February 07, 2018   Admitting Physician Jesus Gasparini Mariea Clonts, MD  Outpatient Primary MD for the patient is Patient, No Pcp Per  LOS - 1   Chief Complaint  Patient presents with  . 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 male with past medical history relevant for tobacco abuse and morbid obesity admitted on 02/07/2018 with A. fib with RVR initially heart rate over 240, in the ED failed IV adenosine, failed metoprolol and failed Cardizem cardiology switched him to IV amiodarone, converted back to sinus rhythm several hours later on IV amiodarone which has now been discontinued by cardiology.  Prior to admission at least for pill of 1 week patient had sore throat, headache,  Fatigue, malaise and URI symptoms  Plan:- 1)New Onset Afib with RVR----In the setting ofsevere hypokalemia with potassium of 2.2, potassium has now normalized, and patient failed IV adenosine, failed IV Cardizem, failed IV and p.o. metoprolol, cardiology started him on amiodarone on 2018/02/07, patient converted to sinus rhythm several hours later, remains in sinus tach. CHA2DS2- VASc score   is = Zero,  Which is  equal to = 0.2 %  annual risk of stroke, avoid aspirin due to low platelet count ,  full anticoagulation is NOT  Indicated.  Avoid aspirin due to low platelets.    Magnesium is normal and TSH is 3.0.  Unable to perform echocardiogram due to persistent tachycardia. patient received over 7 L of IV fluids due to persistent hypotension, tachycardia in the setting of A. fib with RVR and hemodynamic instability  2)AKI----acute kidney injury  -renal function improving with hydration,   creatinine on admission= 2.61  ,   baseline creatinine = unknown (per patient and his mother previously normal)   , creatinine is now= 2.0  , renally adjust medications, avoid nephrotoxic agents/dehydration/hypotension, continue to hydrate IV  And po  3)Thrombocytopenia and Elevated LFTs---- DIC panel is not consistent with DIC, PT/INR and PTT are not elevated, fibrinogen is over 800, d-dimer was elevated but this is an acute phase reactant, platelet count is down to 37K, Absolute reticulocyte count is low at 8.2, LDH elevated at 292, alkaline phosphatase is 175, AST 53 and ALT 54, albumin is low at 2.2 T bili is 12.1, direct bili is 7.6, suspect some degree of obstructive jaundice, Haptoglobulin pending. CMV, EBV and acute viral hepatitis titers are pending.  Lipid profile suggest hypertriglyceridemia with triglycerides of 594 , abdominal ultrasound suggest possible  fatty liver without acute findings  otherwise.  Blood cultures are pending . D/w Dr Jesus Short (on-call hematologist), official consult pending  4)Acute Anemia--- no prior history of anemia, on admission hemoglobin was 15.3, patient received over 7 L of IV fluids due to persistent hypotension in the setting of A. fib with RVR and hemodynamic instability, hemoglobin now is down to 12.1, suspect hemodilution, no evidence of ongoing bleeding  5) morbid obesity-suspect OSA, asleep overnight patient had frequent episodes of desaturation, please use CPAP nightly  Disposition/Need for in-Hospital  Stay- patient unable to be discharged at this time due to  tachycardia in the setting of A. fib with RVR and hemodynamic instability requiring aggressive IV fluids as well as declining platelet count requiring further work-up including hematology consult, and acute kidney injury requiring IV fluids.  [facility] further hematology input. Neither cardiology nor hematology services are available at [facility] over the weekend   Code Status : Full   Disposition Plan  : Transfer to [facility]  Consults  :  Hematology/cardiology   DVT Prophylaxis  :  SCDs   Lab Results  Component Value Date   PLT 37 (L) 01/09/2018    Inpatient Medications  Scheduled Meds: . Chlorhexidine Gluconate Cloth  6 each Topical Q0600  . mouth rinse  15 mL Mouth Rinse BID  . mupirocin ointment  1 application Nasal BID  . sodium chloride flush  3 mL Intravenous Q12H   Continuous Infusions: . sodium chloride 150 mL/hr at 01/09/18 0550  . sodium chloride     PRN Meds:.sodium chloride, acetaminophen **OR** acetaminophen, albuterol, diphenhydrAMINE, magic mouthwash **AND** lidocaine, ondansetron **OR** ondansetron (ZOFRAN) IV, phenol, polyethylene glycol, sodium chloride flush, traZODone   Anti-infectives (From admission, onward)   None       Objective:   Vitals:   01/09/18 1145 01/09/18 1200 01/09/18 1224 01/09/18 1300  BP:      Pulse: (!) 128 (!) 133  (!) 130  Resp: (!) 48 (!) 34  16  Temp: 99.8 F (37.7 C)     TempSrc: Oral     SpO2:   90%   Weight:      Height:        Wt Readings from Last 3 Encounters:  01/09/18 (!) 137.6 kg  01/04/18 133.8 kg  01/04/17 111.1 kg     Intake/Output Summary (Last 24 hours) at 01/09/2018 1444 Last data filed at 01/09/2018 0550 Gross per 24 hour  Intake 4440.74 ml  Output 800 ml  Net 3640.74 ml    Physical Exam Patient is examined daily including today on 01/09/18 , exams remain the same as of yesterday except that has changed    Physical Examination: General appearance - alert, obese appearing, and in no distress  Mental status - alert, oriented to person, place, and time, Eyes - sclera icterus Neck - supple, no JVD elevation , Chest - clear  to auscultation bilaterally, symmetrical air movement,  Heart - S1 and S2 normal, now regular, HR 130s  Abdomen - soft, nontender, nondistended, increased Truncal adiposity Neurological - screening mental status exam normal, neck supple without rigidity, cranial nerves II through XII intact, DTR's normal and symmetric Extremities - no pedal edema noted, intact peripheral pulses  Skin - warm, dry   Data Review:   Micro Results Recent Results (from the past 240 hour(s))  Group A Strep by PCR     Status: None   Collection Time: 01/04/18  2:34 PM  Result Value Ref Range Status  Group A Strep by PCR NOT DETECTED NOT DETECTED Final    Comment: Performed at [facility], [address], [place], [place] [address]  MRSA PCR Screening     Status: Abnormal   Collection Time: 2018-01-12  2:48 PM  Result Value Ref Range Status   MRSA by PCR POSITIVE (A) NEGATIVE Final    Comment:        The GeneXpert MRSA Assay (FDA approved for NASAL specimens only), is one component of a comprehensive MRSA colonization surveillance program. It is not intended to diagnose MRSA infection nor to guide or monitor treatment for MRSA infections. RESULT CALLED TO, READ BACK BY AND VERIFIED WITH: MURPHY E. AT [id number] ON [id number] BY THOMPSON S. Performed at [facility], [address] [place], [place] [address]   Culture, blood (Routine X 2) w Reflex to ID Panel     Status: None (Preliminary result)   Collection Time: 01/09/18  1:14 PM  Result Value Ref Range Status   Specimen Description BLOOD LEFT ANTECUBITAL  Final   Special Requests   Final    BOTTLES DRAWN AEROBIC AND ANAEROBIC Blood Culture adequate volume Performed at [facility], [address] [place], [place] [address]    Culture  PENDING  Incomplete   Report Status PENDING  Incomplete   Radiology Reports US Abdomen Complete  Result Date: 01/09/2018 CLINICAL DATA:  Elevated LFTs. EXAM: ABDOMEN ULTRASOUND COMPLETE COMPARISON:  None. FINDINGS: Gallbladder: No evidence for gallstones. Gallbladder incompletely distended. Gallbladder wall thickness measured at 3-4 mm and likely accentuated by the nondistended state. No pericholecystic fluid. Sonographer reports no sonographic Murphy sign. Common bile duct: Diameter: 4 mm Liver: Coarsening of the echotexture with decreased through transmission suggests fatty deposition. No focal abnormality. Portal vein is patent on color Doppler imaging with normal direction of blood flow towards the liver. IVC: No abnormality visualized. Pancreas: Visualized portion unremarkable. Spleen: Size and appearance within normal limits. Right Kidney: Length: 12.9 cm. Limited assessment lower pole due to limitations with patient positioning. Left Kidney: Length: 13.2 cm. Echogenicity within normal limits. No mass or hydronephrosis visualized. Abdominal aorta: No aneurysm visualized. Other findings: None. IMPRESSION: 1. No acute findings. Minimal gallbladder wall thickening likely related to nondistended state of the gallbladder. 2. Coarsening of the hepatic echotexture suggests fatty deposition. Electronically Signed   By: Kennith Center M.D.   On: 01/09/2018 14:36   Nm Pulmonary Perf And Vent  Result Date: 01/09/2018 CLINICAL DATA:  Shortness of breath and tachycardia EXAM: NUCLEAR MEDICINE VENTILATION - PERFUSION LUNG SCAN VIEWS: Anterior, posterior, left lateral, right lateral, RPO, LPO, RAO, LAO-ventilation and perfusion RADIOPHARMACEUTICALS:  33.0 mCi of Tc-16m DTPA aerosol inhalation and 4.4 mCi Tc33m-MAA IV COMPARISON:  Chest radiograph January 12, 2018 FINDINGS: Ventilation: Radiotracer uptake is homogeneous and symmetric bilaterally. No ventilation defects are evident. Perfusion: Radiotracer uptake is  homogeneous and symmetric bilaterally. No perfusion defects are identified. IMPRESSION: No ventilation or perfusion defects evident. Very low probability of pulmonary embolus. Electronically Signed   By: Bretta Bang III M.D.   On: 01/09/2018 08:58   Dg Chest Port 1 View  Result Date: 2018-01-12 CLINICAL DATA:  Tachycardia. EXAM: PORTABLE CHEST 1 VIEW COMPARISON:  06/07/2015. FINDINGS: Normal heart size. Clear lung fields. Elevated RIGHT hemidiaphragm uncertain significance, not present previously. No effusion or pneumothorax. Bones unremarkable. IMPRESSION: Elevated RIGHT hemidiaphragm.  No visible consolidation or edema. Electronically Signed   By: Elsie Stain M.D.   On: 01-12-2018 11:16     CBC Recent Labs  Lab 12/29/2017 1144 12/24/2017 1702 01/09/18 0410  WBC 5.0  --  11.7*  HGB 15.3  --  12.1*  HCT 43.3  --  35.4*  PLT 49* 43* 37*  MCV 84.4  --  84.3  MCH 29.8  --  28.8  MCHC 35.3  --  34.2  RDW 13.7  --  14.2  LYMPHSABS 0.4*  --   --   MONOABS 0.1  --   --   EOSABS 0.0  --   --   BASOSABS 0.0  --   --     Chemistries  Recent Labs  Lab 01/18/2018 1144 01/07/2018 1702 01/09/18 0410  NA 136 133* 135  K 2.5* 3.1* 4.1  CL 98 101 106  CO2 18* 17* 16*  GLUCOSE 97 111* 106*  BUN 40* 41* 46*  CREATININE 2.61* 2.41* 2.03*  CALCIUM 8.8* 7.7* 7.8*  MG 2.2  --   --   AST 62*  --  73*  ALT 43  --  54*  ALKPHOS 146*  --  175*  BILITOT 10.3*  --  12.1*   ------------------------------------------------------------------------------------------------------------------ Recent Labs    01/09/18 0410  CHOL 136  HDL <10*  LDLCALC UNABLE TO CALCULATE IF TRIGLYCERIDE OVER 400 mg/dL  TRIG 562*  CHOLHDL NOT CALCULATED    No results found for: HGBA1C ------------------------------------------------------------------------------------------------------------------ Recent Labs    12/21/2017 1144  TSH 3.082    ------------------------------------------------------------------------------------------------------------------ Recent Labs    01/09/18 1313  RETICCTPCT 0.2*    Coagulation profile Recent Labs  Lab 12/22/2017 1702  INR 1.20    Recent Labs    12/30/2017 1702  DDIMER 7.68*    Cardiac Enzymes Recent Labs  Lab 01/15/2018 1144  TROPONINI 0.03*   ------------------------------------------------------------------------------------------------------------------ No results found for: BNP   Shon Hale M.D on 01/09/2018 at 2:44 PM  Pager---[phone] Go to [email or web address] - [email or web address] for contact info  [facility] - Office  [phone]

## 2018-01-09 NOTE — Progress Notes (Signed)
Patient recently intubated. Currently awaiting initial vent setting orders.

## 2018-01-09 NOTE — Anesthesia Procedure Notes (Addendum)
Procedure Name: Intubation Date/Time: 01/09/2018 10:42 PM Performed by: Montel Clock, CRNA Pre-anesthesia Checklist: Patient identified, Emergency Drugs available, Suction available, Patient being monitored and Timeout performed Patient Re-evaluated:Patient Re-evaluated prior to induction Oxygen Delivery Method: Ambu bag Preoxygenation: Pre-oxygenation with 100% oxygen Induction Type: IV induction and Rapid sequence Laryngoscope Size: Mac and 4 Grade View: Grade II Tube type: Subglottic suction tube Tube size: 8.0 mm Number of attempts: 1 Airway Equipment and Method: Stylet Placement Confirmation: ETT inserted through vocal cords under direct vision,  breath sounds checked- equal and bilateral and CO2 detector Secured at: 23 cm Tube secured with: Tape Dental Injury: Teeth and Oropharynx as per pre-operative assessment

## 2018-01-09 NOTE — Progress Notes (Signed)
Critical lactic acid 3.0. Notified Dr. Mariea ClontsEmokpae

## 2018-01-09 NOTE — Progress Notes (Addendum)
Progress Note  Patient Name: Jesus Short Date of Encounter: 01/09/2018  Primary Cardiologist: Carlyle Dolly, MD   Subjective   Reports improvement in his palpitations but still has multiple complaints including a sore throat, low-grade fever, and generalized malaise.   Inpatient Medications    Scheduled Meds: . Chlorhexidine Gluconate Cloth  6 each Topical Q0600  . mouth rinse  15 mL Mouth Rinse BID  . mupirocin ointment  1 application Nasal BID  . sodium chloride flush  3 mL Intravenous Q12H   Continuous Infusions: . sodium chloride 150 mL/hr at 01/09/18 0550  . sodium chloride    . amiodarone 30 mg/hr (01/09/18 0550)  . diltiazem (CARDIZEM) infusion Stopped (12/27/2017 1730)   PRN Meds: sodium chloride, acetaminophen **OR** acetaminophen, albuterol, diphenhydrAMINE, magic mouthwash **AND** lidocaine, ondansetron **OR** ondansetron (ZOFRAN) IV, phenol, polyethylene glycol, sodium chloride flush, traZODone   Vital Signs    Vitals:   01/09/18 0500 01/09/18 0515 01/09/18 0530 01/09/18 0545  BP:      Pulse:      Resp: (!) 38 18 18 (!) 23  Temp:      TempSrc:      SpO2:      Weight:      Height:        Intake/Output Summary (Last 24 hours) at 01/09/2018 1037 Last data filed at 01/09/2018 0550 Gross per 24 hour  Intake 4440.74 ml  Output 800 ml  Net 3640.74 ml   Filed Weights   12/31/2017 1619 01/09/18 0400  Weight: (!) 137 kg (!) 137.6 kg    Telemetry    Previously in atrial fibrillation with RVR, HR in the 140's. Converted to sinus tachycardia on 12/22/2017 at 2100. HR has been in the 110's to 120's since.  - Personally Reviewed  ECG    Sinus tachycardia, HR 124. - Personally Reviewed  Physical Exam   General: Well developed, overweight African American male appearing in no acute distress. Head: Normocephalic, atraumatic.  Neck: Supple without bruits, JVD not elevated. Lungs:  Resp regular and unlabored, CTA without wheezing or rales. Heart: Regular  rhythm, tachycardiac rate, S1, S2, no S3, S4, or murmur; no rub. Abdomen: Soft, non-distended with normoactive bowel sounds. No hepatomegaly. No rebound/guarding. Tender to palpation along epigastric region.  Extremities: No clubbing, cyanosis, or edema. Distal pedal pulses are 2+ bilaterally. Neuro: Alert and oriented X 3. Moves all extremities spontaneously. Psych: Normal affect.  Labs    Chemistry Recent Labs  Lab 01/09/2018 1144 01/05/2018 1702 01/09/18 0410  NA 136 133* 135  K 2.5* 3.1* 4.1  CL 98 101 106  CO2 18* 17* 16*  GLUCOSE 97 111* 106*  BUN 40* 41* 46*  CREATININE 2.61* 2.41* 2.03*  CALCIUM 8.8* 7.7* 7.8*  PROT 6.8  --  5.5*  ALBUMIN 2.8*  --  2.2*  AST 62*  --  73*  ALT 43  --  54*  ALKPHOS 146*  --  175*  BILITOT 10.3*  --  12.1*  GFRNONAA 31* 35* 43*  GFRAA 36* 40* 49*  ANIONGAP 20* 15 13     Hematology Recent Labs  Lab 12/26/2017 1144 01/05/2018 1702 01/09/18 0410  WBC 5.0  --  11.7*  RBC 5.13  --  4.20*  HGB 15.3  --  12.1*  HCT 43.3  --  35.4*  MCV 84.4  --  84.3  MCH 29.8  --  28.8  MCHC 35.3  --  34.2  RDW 13.7  --  14.2  PLT 49* 43* 37*    Cardiac Enzymes Recent Labs  Lab 12/25/2017 1144  TROPONINI 0.03*   No results for input(s): TROPIPOC in the last 168 hours.   BNPNo results for input(s): BNP, PROBNP in the last 168 hours.   DDimer  Recent Labs  Lab 01/02/2018 1702  DDIMER 7.68*     Radiology    Nm Pulmonary Perf And Vent  Result Date: 01/09/2018 CLINICAL DATA:  Shortness of breath and tachycardia EXAM: NUCLEAR MEDICINE VENTILATION - PERFUSION LUNG SCAN VIEWS: Anterior, posterior, left lateral, right lateral, RPO, LPO, RAO, LAO-ventilation and perfusion RADIOPHARMACEUTICALS:  33.0 mCi of Tc-46mDTPA aerosol inhalation and 4.4 mCi Tc945mAA IV COMPARISON:  Chest radiograph January 08, 2018 FINDINGS: Ventilation: Radiotracer uptake is homogeneous and symmetric bilaterally. No ventilation defects are evident. Perfusion: Radiotracer  uptake is homogeneous and symmetric bilaterally. No perfusion defects are identified. IMPRESSION: No ventilation or perfusion defects evident. Very low probability of pulmonary embolus. Electronically Signed   By: WiLowella GripII M.D.   On: 01/09/2018 08:58   Dg Chest Port 1 View  Result Date: 01/17/2018 CLINICAL DATA:  Tachycardia. EXAM: PORTABLE CHEST 1 VIEW COMPARISON:  06/07/2015. FINDINGS: Normal heart size. Clear lung fields. Elevated RIGHT hemidiaphragm uncertain significance, not present previously. No effusion or pneumothorax. Bones unremarkable. IMPRESSION: Elevated RIGHT hemidiaphragm.  No visible consolidation or edema. Electronically Signed   By: JoStaci Righter.D.   On: 01/02/2018 11:16    Cardiac Studies   Echocardiogram: Pending  Patient Profile     [age].o. male w/ PMH of obesity, tobacco use, and no prior cardiac history who [facility] for evaluation of fever, chills, fatigue, and decreased appetite. Also reported palpitations and was found to be in atrial fibrillation with RVR. Found to have an AKI, elevated LFT's, and thrombocytopenia.   Assessment & Plan    1. New-Onset Atrial Fibrillation with RVR - new diagnosis as of this admission which is thought to be driven by his AKI, Hyperbilirubinemia, DIC, and electrolyte abnormalities. Initially started on IV Cardizem with intermittent doses of Lopressor but rates remained significantly elevated and he became hypotensive. This led to IV Amiodarone being initiated and he converted from atrial fibrillation to sinus tachycardia on 9/19 at 2100. He has remained in sinus tachycardia since with HR in the 110's to 120's. Would stop Amiodarone for now given he has converted to sinus tachycardia and elevated LFT's.    -This patients CHA2DS2-VASc Score and unadjusted Ischemic Stroke Rate (% per year) is equal to 0.2 % stroke rate/year from a score of 0. He has not been started on ASA or anticoagulation given  his low-platelet count. An echocardiogram is pending but would wait until rates have improved to obtain this as his HR is sustaining in the 120's.   2. Renal failure - creatinine 2.61 on admission, improved to 2.03 this AM.  - remains on IVF at 150 mL/hr.   3. Thrombocytopenia/ DIC - platelet count 49K on admission, at 37K this AM. Would question DIC based off of panel obtained this AM as platelet count is < 50,000, Fibrinogen is elevated to > 800, and D-dimer elevated to 7.68 (VQ Scan negative).  - he denies any evidence of active bleeding at this time. - would recommend Hematology consult.   4. Hyperbilirubinemia - Initial labs showed AST 62, ALT 43, Alk Phos 146, and Total Bilirubin 10.3. Trending upwards at 73, 54, 175, and 12.1 respectively. Triglycerides elevated to 594 on FLP.  -  Liver US is pending.  - further evaluation per admitting team.   5. Hypokalemia - K+ 2.5 on admission, improved to 4.1 this AM.    For questions or updates, please contact [place] Please consult [email or web address] for contact info under Cardiology/STEMI.   Arna Medici , PA-C 10:37 AM 01/09/2018 Pager: [phone]   Attending note:  Patient seen with Ms. Delano Metz, symptoms and presentation discussed.  I also reviewed cardiology consultation by Dr. Harl Bowie.  Mr. Cafarelli presents to the hospital with increasing shortness of breath and malaise, recent sore throat, not eating well.  Not clear if he has been febrile at home or not.  He was found to be in rapid atrial fibrillation on initial evaluation in the ER, however subsequent work-up has uncovered multiple abnormalities including acute renal failure, progressive thrombocytopenia, hyperbilirubinemia with scleral icterus and tea colored urine, elevated lactate, also elevated d-dimer and fibrinogen.  INR is 1.2.  He has been admitted to the ICU on the hospitalist service.  Heart rate was initially managed with intravenous diltiazem  although he was placed on amiodarone by Dr. Harl Bowie and subsequently converted to sinus tachycardia where he remains today.  On examination he is somewhat restless, states that he cannot get comfortable in bed.  Still complains of intermittent sore throat and dysphasia.  Lungs are clear without late wheezing.  Cardiac exam reveals rapid regular rhythm without obvious gallop.  Scleral icterus.  Morbidly obese, difficult to assess JVP.  Lab work shows potassium 4.1 up from 2.5 initially, BUN and creatinine 46 and 2.0 which are trending down, albumin 2.2, AST 73, ALT 54, total protein 55.5, direct bilirubin 7.6, HIV nonreactive, triglycerides 594, lactic acid 4.1, WBC 11.7, hemoglobin 12.1, platelets 37, peripheral smear not showing schistocytes d-dimer 7.68, fibrinogen greater than 100, INR 1.2, PCR for MRSA positive.  Initial ECG showed rapid atrial fibrillation with nonspecific ST changes, subsequent tracing showed sinus tachycardia.  Chest x-ray does not report any pulmonary edema with elevated right hemidiaphragm.  VQ scan very low probability for pulmonary embolus.  From a cardiac perspective, patient presented in rapid atrial fibrillation, however has converted to sinus tachycardia where he remains today.  We are stopping amiodarone at this time, particularly in light of other acute abnormalities including what looks to be a developing transaminitis.  Unifying diagnosis for his presentation is not clear as yet, would be concerned about developing DIC or TTP, he needs further work-up and input from hematology as well as potentially ID.  His CHADSVASC score is 0, and clearly he would not be anticoagulated anyway with progressive thrombocytopenia.  I have spoken with Dr. Denton Brick and recommended that the patient be transferred to [facility] where he can have additional subspecialist evaluation.  Our cardiology service can follow along, but other than an echocardiogram, would not anticipate any further cardiac  testing at this point.   Satira Sark, M.D., F.A.C.C.

## 2018-01-09 NOTE — Progress Notes (Signed)
First echo attempt, HR in the 130s.

## 2018-01-09 NOTE — Progress Notes (Signed)
Pt going down for NM lung scan.

## 2018-01-09 NOTE — Progress Notes (Signed)
Pt o2 sat 83% Hr 144 SVT Resp 40s-50s. Pt diaphoretic temp 101.3. Notified Dr. Mariea ClontsEmokpae gave orders for EKG and will [facility] to [facility] instead of [facility]. Instructed to notify Cardiology. Paged Dr. Diona BrownerMcdowell awaiting response. Pt placed on BIPAP. EKG no different from prior. [facility] awaiting [facility].

## 2018-01-10 DIAGNOSIS — I469 Cardiac arrest, cause unspecified: Secondary | ICD-10-CM | POA: Diagnosis present

## 2018-01-10 LAB — HAPTOGLOBIN: Haptoglobin: 252 mg/dL — ABNORMAL HIGH (ref 34–200)

## 2018-01-10 LAB — BLOOD CULTURE ID PANEL (REFLEXED)
Acinetobacter baumannii: NOT DETECTED
CANDIDA ALBICANS: NOT DETECTED
CANDIDA GLABRATA: NOT DETECTED
Candida krusei: NOT DETECTED
Candida parapsilosis: NOT DETECTED
Candida tropicalis: NOT DETECTED
ENTEROBACTER CLOACAE COMPLEX: NOT DETECTED
ENTEROBACTERIACEAE SPECIES: NOT DETECTED
ENTEROCOCCUS SPECIES: NOT DETECTED
Escherichia coli: NOT DETECTED
Haemophilus influenzae: NOT DETECTED
Klebsiella oxytoca: NOT DETECTED
Klebsiella pneumoniae: NOT DETECTED
LISTERIA MONOCYTOGENES: NOT DETECTED
NEISSERIA MENINGITIDIS: NOT DETECTED
PSEUDOMONAS AERUGINOSA: NOT DETECTED
Proteus species: NOT DETECTED
STAPHYLOCOCCUS AUREUS BCID: NOT DETECTED
STREPTOCOCCUS AGALACTIAE: NOT DETECTED
STREPTOCOCCUS PYOGENES: NOT DETECTED
STREPTOCOCCUS SPECIES: DETECTED — AB
Serratia marcescens: NOT DETECTED
Staphylococcus species: NOT DETECTED
Streptococcus pneumoniae: NOT DETECTED

## 2018-01-10 LAB — HEPATITIS PANEL, ACUTE
HCV Ab: <0.1 s/co ratio (ref 0.0–0.9)
HEP A IGM: NEGATIVE
HEP B C IGM: NEGATIVE
Hepatitis B Surface Ag: NEGATIVE

## 2018-01-10 LAB — URINE CULTURE: CULTURE: NO GROWTH

## 2018-01-10 LAB — CMV ANTIBODY, IGG (EIA): CMV AB - IGG: 2.2 U/mL — AB (ref 0.00–0.59)

## 2018-01-10 LAB — CMV IGM

## 2018-01-10 MED ORDER — DOCUSATE SODIUM 50 MG/5ML PO LIQD: 100.0000 mg | Freq: Two times a day (BID) | ORAL | Status: DC | PRN

## 2018-01-10 MED ORDER — FENTANYL BOLUS VIA INFUSION
50.0000 ug | INTRAVENOUS | Status: DC | PRN
  Filled 2018-01-10: qty 50

## 2018-01-10 MED ORDER — SODIUM BICARBONATE 8.4 % IV SOLN
INTRAVENOUS | Status: AC
  Filled 2018-01-10: qty 150

## 2018-01-10 MED ORDER — SODIUM CHLORIDE 0.9 % IV SOLN: INTRAVENOUS | Status: DC

## 2018-01-10 MED ORDER — ACETAMINOPHEN 650 MG RE SUPP: 650.0000 mg | Freq: Four times a day (QID) | RECTAL | Status: DC | PRN

## 2018-01-10 MED ORDER — SODIUM CHLORIDE 0.9 % IV SOLN: 250.0000 mL | INTRAVENOUS | Status: DC | PRN

## 2018-01-10 MED ORDER — TENECTEPLASE 50 MG IV KIT
50.0000 mg | PACK | INTRAVENOUS | Status: DC
  Filled 2018-01-10: qty 10

## 2018-01-10 MED ORDER — MIDAZOLAM HCL 2 MG/2ML IJ SOLN: 2.0000 mg | INTRAMUSCULAR | Status: DC | PRN

## 2018-01-10 MED ORDER — INSULIN ASPART 100 UNIT/ML ~~LOC~~ SOLN: 1.0000 [IU] | SUBCUTANEOUS | Status: DC

## 2018-01-10 MED ORDER — FAMOTIDINE IN NACL 20-0.9 MG/50ML-% IV SOLN: 20.0000 mg | Freq: Two times a day (BID) | INTRAVENOUS | Status: DC

## 2018-01-10 MED ORDER — FENTANYL CITRATE (PF) 100 MCG/2ML IJ SOLN: 50.0000 ug | Freq: Once | INTRAMUSCULAR | Status: DC

## 2018-01-10 MED ORDER — FENTANYL 2500MCG IN NS 250ML (10MCG/ML) PREMIX INFUSION
25.0000 ug/h | INTRAVENOUS | Status: DC
  Filled 2018-01-10: qty 250

## 2018-01-10 MED ORDER — ACETAMINOPHEN 160 MG/5ML PO SOLN: 650.0000 mg | Freq: Four times a day (QID) | ORAL | Status: DC | PRN

## 2018-01-10 MED FILL — Medication: Qty: 2 | Status: AC

## 2018-01-12 LAB — CULTURE, BLOOD (ROUTINE X 2): SPECIAL REQUESTS: ADEQUATE

## 2018-01-12 LAB — EPSTEIN-BARR VIRUS VCA ANTIBODY PANEL
EBV NA IGG: 151 U/mL — AB (ref 0.0–17.9)
EBV VCA IgG: 409 U/mL — ABNORMAL HIGH (ref 0.0–17.9)

## 2018-01-20 NOTE — Progress Notes (Signed)
I was called emergently to see a patient that was emergently intubated by anesthesia and who had asystole cardiac arrest just following intubation. When I arrived at bedside, the team had just gotten ROSC. CXR showed diffuse b/l pulmonary infiltrates consistent with flash pulmonary edema. Pox reading 88% but with poor waveform. SBP 90 on 25mcg Neo. RT at beside attempting to get ABG. RN's at bedside placing a 2nd PIV and a Foley catheter. I stepped just outside his room to place order in his chart. I was called back into his room as he had again lost a pulse. Rhythm was asystole. CPR performed for 42 minutes with no effect. Multiple rounds of Epi and Bicarb given as well as TNK, Calcium, Magnesium, D50, Amiodarone. See full code documentation taken by Dr Julian ReilGardner. Time of death 00:56.

## 2018-01-20 NOTE — Code Documentation (Signed)
Documentation of timing of code interventions on second cardiac arrest (I performed the recorder role during ACLS, Dr. Merlene PullingHammonds ran the code):  1214 am - PEA arrest, code blue called, compressions started 1215 - 1 amp epi 1216 - pulse check - asystole no pulse 1217 - 1 amp bicarb, 1 amp D50 1218 and 30 sec - 1 amp epi 1219 - 300 amiodarone, PC rhythm asystole, no pulse 1220 - 1 amp bicarb 1222 - 1 amp epi, PC rhythm asystole, no pulse 1224 - PC rhythm asystole, no pulse 1225 - 1 amp epi 1227 - PC rhythm asystole no pulse, 1 amp bicarb 1228 and 30 sec - 1 amp epi 1229 - PC rhythm asystole no pulse 1231 - PC rhythm asystole no pulse 1232 - 1 amp epi 1233 - PC rhythm asystole no pulse 1234 - 2gm magnesium sulfate 1235 - PC rhythm asystole no pulse 1235 and 45 sec - 1 amp epi 1237 - PC rhythm asystole no pulse 1237 and 30 sec - 1 amp bicarb 1239 - 1 amp epi, PC rhythm asystole no pulse 1239 and 15 sec - TNKase 10ml pushed 1240 - calcium chloride 1gm pushed 1241 - PC rhythm asystole no pulse 1242 - 1 amp epi 1243 - PC rhythm asystole no pulse, US shows good sliding lung bilaterally 1245 - PC rhythm asystole no pulse, US shows no cardiac movement 1246 - 1 amp epi 1247 - PC rhythm asystole no pulse 1249 - PC rhythm asystole no pulse 1250 - 1 amp epi 1252 - PC rhythm asystole no pulse 1253 - 1 amp epi 1254 - rhythm asystole no pulse 1256 - rhythm asystole no pulse, decision made to cease resuscitation efforts, time of death 1256 am.

## 2018-01-20 NOTE — Progress Notes (Signed)
Responded to code blue being called on patient.  First code at about 11:30 was PEA arrest, run by Dr. Judd Lienelo, ~6 min down time or so followed by ROSC.  Dr. Merlene PullingHammonds arrived at bedside.  Noted to be very hypoxic with bagging, even with PEEP valve post arrest, O2 sats in the 70s, put on vent with O2 sats getting up to 88% with poor wave form.  Patient on Neo.  Second code PEA arrest at 1214, Dr. Merlene PullingHammonds ran code (see my code documentation notes for specifics), for 42 mins with no effect, all pulse checks showing asystole.  Time of death [id number].  Family understandably grieving at sudden and unexpected in hospital death, ETT removed at their strong request.  Cause of death is believed to be hypoxia secondary to flash pulmonary edema as suggested by CXR post intubation.  Likely wont be an ME case, but will recommend that we offer autopsy if family desires.

## 2018-01-20 NOTE — Progress Notes (Signed)
ETT removed per family request and verbal order from Dr. Julian ReilGardner.

## 2018-01-20 NOTE — Discharge Summary (Addendum)
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 male with past medical history relevant for tobacco abuse and morbid obesity who presents to the ED by EMS with rapid heart rate, at the scene EMS thought patient was in SVT with heart rate around 240 bpm.  Patient had dizziness, shortness of breath and palpitations, Additional history obtained from patient's mother at bedside Patient has been ill for the last several days was seen  on 01/04/2018 with "viral syndrome" strep throat test was negative at the time.....Marland Kitchen oral intake has been poor due to sore throat  Patient tells me he has not voided since before midnight on 01/07/2018,   In ED... He is found to have a tachyarrhythmia with heart rate initially close to 200, he received IV adenosine 6 mg followed by another dose of IV adenosine 12 mg his rate slowed down a bit the underlying rhythm looks more like atrial fibrillation  Blood pressures were soft remain tachycardic was started on IV Cardizem drip heart rate remained between 160-180, cardiology consulted, cardiologist advised amiodarone with IV amiodarone bolus heart rate is now finally below 140 bpm  In ED--- chest x-ray without acute findings, lactic acid is elevated at 5.9, TSH is normal at 3.0, troponin borderline at 0.03 with a potassium of 2.5,  magnesium is normal at 2.2, creatinine is up to 2.61, BUN is 40 patient admits to ibuprofen  use and decreased oral intake due to feeling unwell over the last several days Patient denies prior kidney problems  In ED--- LFTs are found to be elevated specifically total bili is 10.3 with AST of 62 and ALT of 43, alk phos is 146 bicarb is low at 18 with anion gap of 20,  patient denies any ingection of toxins  Platelet count is down to 49, DIC panel pending, no evidence of active bleeding, suspect some degree of viral syndrome, however hemoglobin is normal at 15.3 and white count is normal at 5  Again HR  improved with IV fluids, IV amiodarone bolus IV Cardizem IV adenosine and p.o. 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)   Hypokalemia   Hyperbilirubinemia and Elevated LFTs   Elevated LFTs  Brief Summary 27 y.o.malewith past medical history relevant for tobacco abuse and morbid obesity admitted on 01/09/2018 with A. fib with RVR initially heart rate over 240, in the ED failed IV adenosine, failed metoprolol and failed Cardizem cardiology switched him to IV amiodarone, converted back to sinus rhythm several hours later (around 9 pm on Jan 10, 2018) while on IV amiodarone  which has now been discontinued by cardiology.  Prior to admission at least for  1 week patient had sore throat, headache,  Fatigue, malaise and URI symptoms, Lactic acidosis is most likely due to poor tissue perfusion  in the setting of persistent tachycardia and soft blood pressures, with IV hydration lactic acid improved from 5.9 on admission to 3.0 prior to transfer to [facility] Step Down Unit    Plan:- 1)New Onset Afib with RVR----In the setting of severe hypokalemia with potassium of 2.2, potassium was replaced and normalized, and patient failed IV adenosine, failed IV Cardizem, failed IV and p.o. metoprolol, cardiology started him on  amiodarone on 01/07/2018, patient converted to sinus rhythm several hours later  (around 9 pm on 01/18/2018 while on iv Amiodarone), remained in sinus tach. CHA2DS2- VASc score is = Zero,Which is equal to = 0.2% annual risk of stroke,full anticoagulation is NOTIndicated.  Avoid aspirin due to low platelets.   Magnesium is normal and TSH is 3.0.  Unable to perform echocardiogram due to persistent tachycardia. patient received about  7 L of IV fluids over 30 plus hours (weighs > 135 kg)  due to persistent hypotension, elevated lactic acid tachycardia in the setting of A. fib with RVR and hemodynamic instability.  Lactic acidosis is most likely due to poor tissue perfusion in the setting of persistent tachycardia and soft blood pressures, with IV hydration lactic acid improved from 5.9 on admission to 3.0 prior to transfer to [facility] Step Down Unit  2)AKI----acute kidney injury  -renal function improving with hydration,   creatinine on admission= 2.61  ,   baseline creatinine = unknown (per patient and his mother previously normal)   , creatinine is now= 2.0  , renally adjusted medications, avoided nephrotoxic agents/dehydration/hypotension, we continued to hydrate patient IV and oral  3)Hematology/Thrombocytopenia and Elevated LFTs---- DIC panel is not consistent with DIC, PT/INR and PTT are not elevated, fibrinogen is over 800, d-dimer was elevated but this is an acute phase reactant, platelet count is down to 37K, Absolute reticulocyte count is low at 8.2, LDH elevated at 292, alkaline phosphatase is 175, AST 53 and ALT 54, albumin is low at 2.2,  T bili is 12.1, direct bili is 7.6, suspect some degree of obstructive jaundice, Haptoglobulin pending. CMV, EBV and acute viral hepatitis titers are pending.  Lipid profile suggest hypertriglyceridemia with triglycerides of 594 , abdominal ultrasound suggest possible  fatty liver without acute findings otherwise. DId not have Abd Pain.   Blood cultures are  pending . D/w Dr Lindi Adie (on-call hematologist), official Hematology consult pending after patient gets to [facility] long hospital  4)Acute Anemia--- no prior history of anemia, on admission hemoglobin was 15.3, patient received about  7 L of IV fluids over 30 plus hours (weighs > 135 kg) due to lactic acid of 5.9 in the setting of persistent hypotension in the setting of A. fib with RVR and hemodynamic instability, hemoglobin now is down to 12.1, suspect hemodilution, no evidence of ongoing bleeding  5)Morbid Obesity-suspect OSA, asleep overnight patient had frequent episodes of desaturation, please use CPAP nightly   Patient had persistent tachycardia in the setting of A. fib with RVR and hemodynamic instability requiring aggressive IV fluids as well as declining platelet count requiring further work-up including hematology consult, and acute kidney injury requiring IV fluids.    Patient was Transferred  to [facility] long hospital stepdown unit for further hematology input as Neither cardiology nor hematology services are available at [facility] over the weekend    Patient decompensated after getting to [facility] long stepdown unit,, developed acute respiratory failure subsequently was intubated after failing BiPAP.  Subsequently patient had cardiopulmonary arrest and persistent asystole.  Please see documentation from  critical care attending Dr. Vernie Murders, please also see documentations of CODE BLUE from ED Physicain Dr. Veryl Speak, as well as CODE BLUE documentation from Dr. Jennette Kettle. Time of Death 02/07/18 at 90 am   Disposition Plan  : Transferred to [facility] Step Down Unit----- Time of Death 02/07/2018 at 44 am    Consults  :  Hematology/cardiology  DVT Prophylaxis  :  SCDs   Time of Death: 07-Feb-2018 at 22 am   Signed:  Anureet Bruington  [facility] 2018-02-07, 7:54 AM

## 2018-01-20 NOTE — Progress Notes (Signed)
CRITICAL VALUE ALERT  Critical Value:gram positive cocci in aerobic blood culture  Date & Time Notied:  12/30/2017 at  0830  Provider Notified: none, [facility]  Orders Received/Actions taken: none

## 2018-01-20 NOTE — ED Provider Notes (Signed)
Emergency department physician note: I responded to the patient's room for a CODE BLUE.  It is my understanding that this patient was transferred from [facility] due to difficulty controlling his atrial fibrillation.  He apparently went into flash pulmonary edema shortly after arrival to this facility.  He failed BiPAP, then was endotracheally intubated.  Shortly after intubation, he decompensated, then went asystolic.  CPR was initiated by ICU staff and a CODE BLUE was called.  I arrived at the patient's room to find CPR in progress.  Chest compressions were being performed and he had just received a second dose of epinephrine.  I was able to see an underlying heart rhythm under the compression waves and CPR was held.  The initial rhythm was atrial fibrillation and pulses were palpable.  A blood pressure was able to be obtained that seemed adequate.  Upon reviewing the information available to me, it appears as though his chest x-ray shows pulmonary edema.  He was administered a dose of Lasix, then Dr. Merlene PullingHammonds arrived to the room to assume control of the code.  CRITICAL CARE Performed by: Geoffery Lyonsouglas Kajal Scalici Total critical care time: 35 minutes Critical care time was exclusive of separately billable procedures and treating other patients. Critical care was necessary to treat or prevent imminent or life-threatening deterioration. Critical care was time spent personally by me on the following activities: development of treatment plan with patient and/or surrogate as well as nursing, discussions with consultants, evaluation of patient's response to treatment, examination of patient, obtaining history from patient or surrogate, ordering and performing treatments and interventions, ordering and review of laboratory studies, ordering and review of radiographic studies, pulse oximetry and re-evaluation of patient's condition.    Geoffery Lyonselo, Jeralynn Vaquera, MD 01/04/2018 [phone]

## 2018-01-20 DEATH — deceased

## 2019-09-13 IMAGING — DX DG CHEST 1V PORT
1 series · 1 of 1 positions shown · non-contrast
Comparison: 01/07/2018, 06/07/2015

CLINICAL DATA: Short of breath

EXAM:
PORTABLE CHEST 1 VIEW

[chest ap]
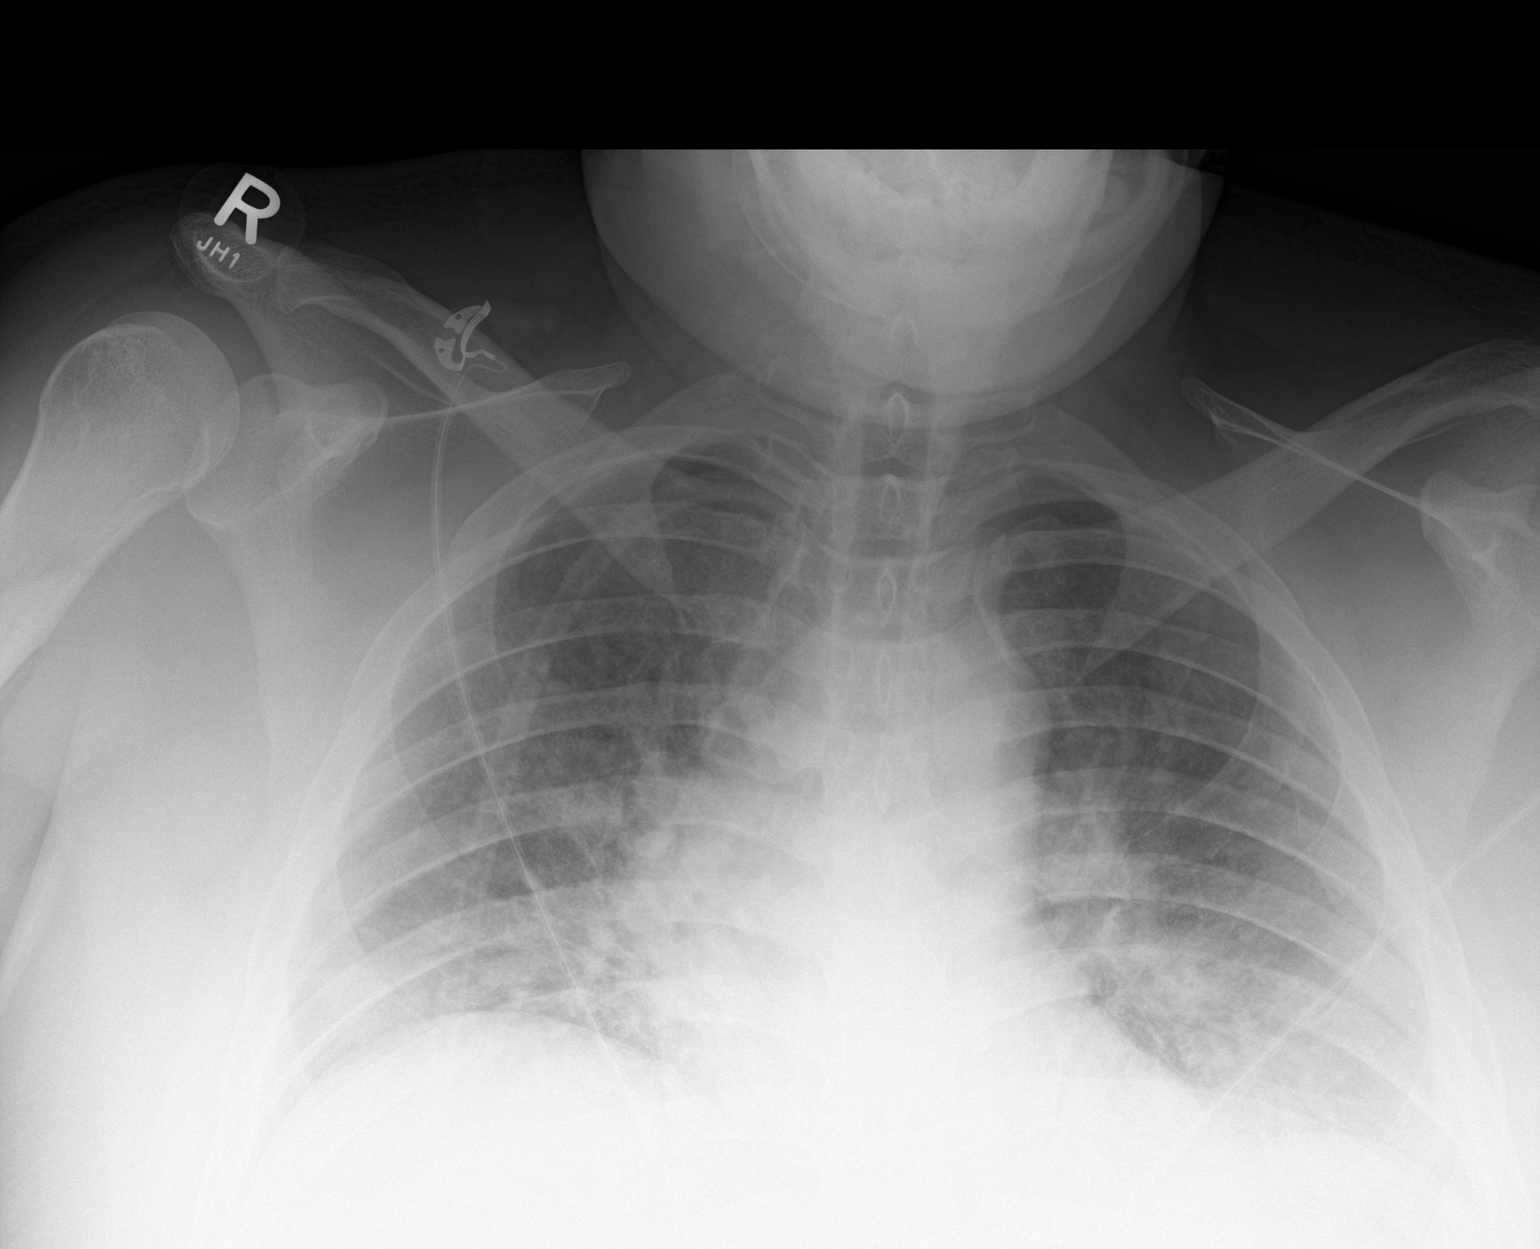

[1 of 1 positions shown; findings below may reference images not displayed]

FINDINGS: Low lung volumes. Hazy perihilar opacity with vascular congestion.
No pleural effusion. Cardiomediastinal silhouette within normal
limits. No pneumothorax.
IMPRESSION: Low lung volumes. Mild vascular congestion and hazy perihilar
opacities suspicious for edema.

## 2019-09-13 IMAGING — US US ABDOMEN COMPLETE
1 series · 13 of 25 positions shown · non-contrast
Comparison: None.

CLINICAL DATA: Elevated LFTs.

EXAM:
ABDOMEN ULTRASOUND COMPLETE

[Series 1: us abdomen complete · 13 of 206 slices shown]
[im 1/206]
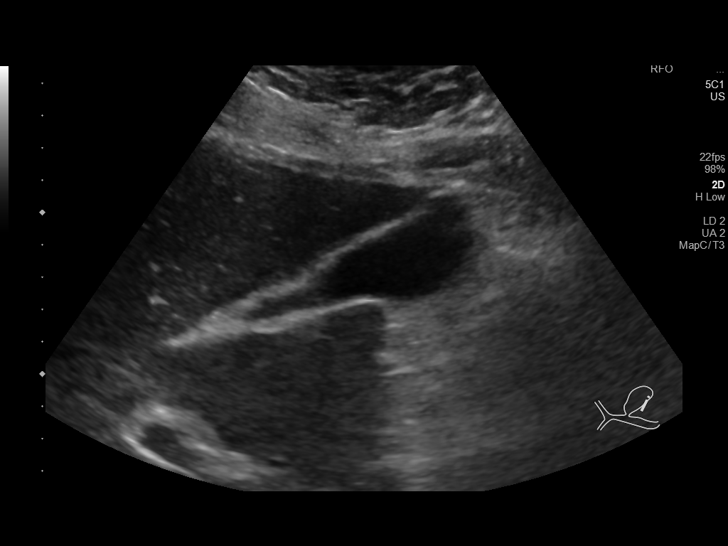
[im 18/206]
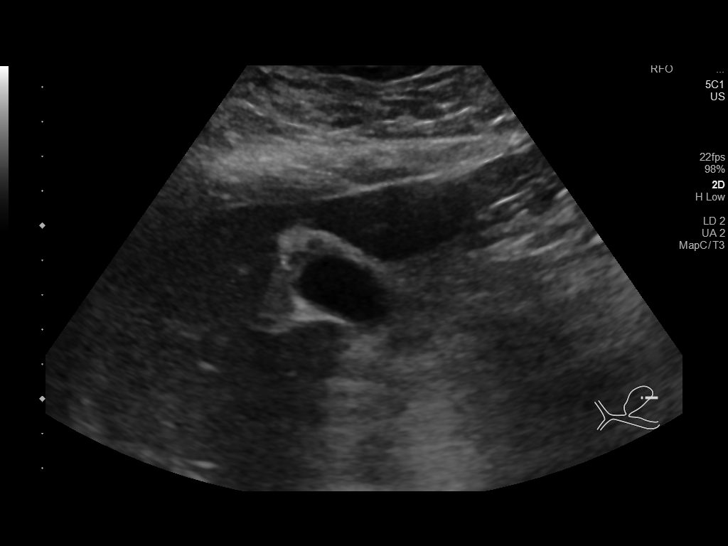
[im 35/206]
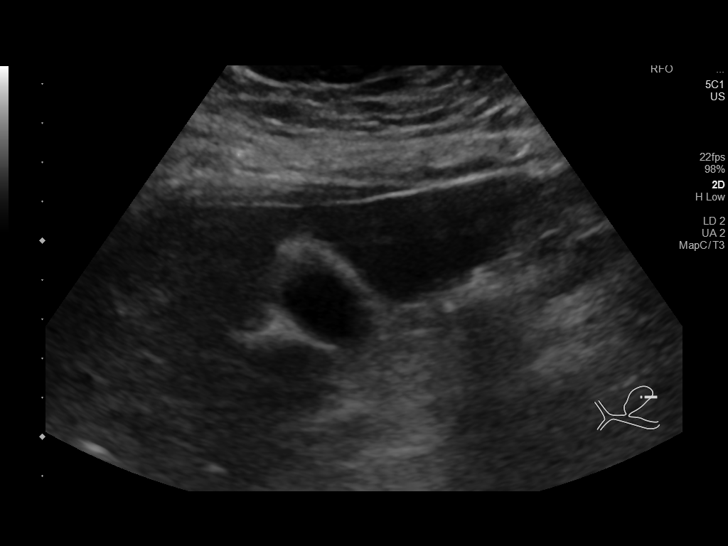
[im 52/206]
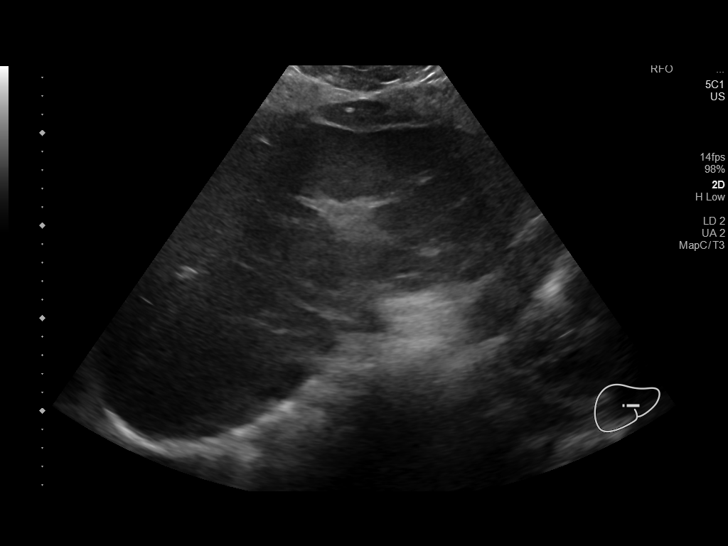
[im 69/206]
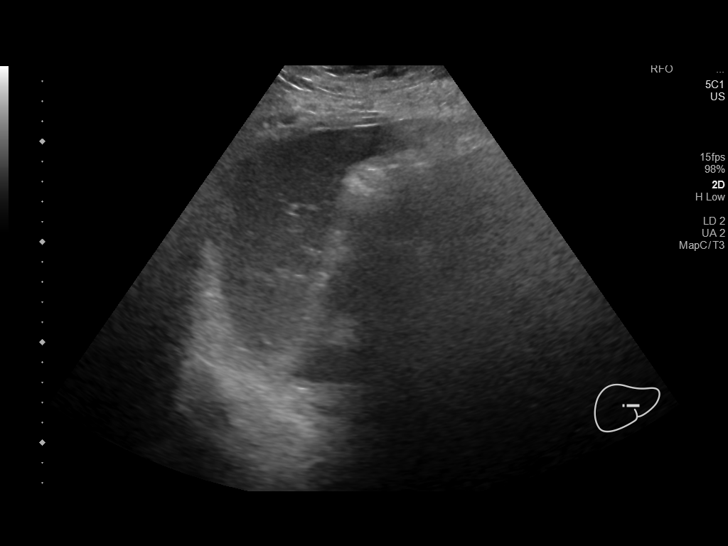
[im 86/206]
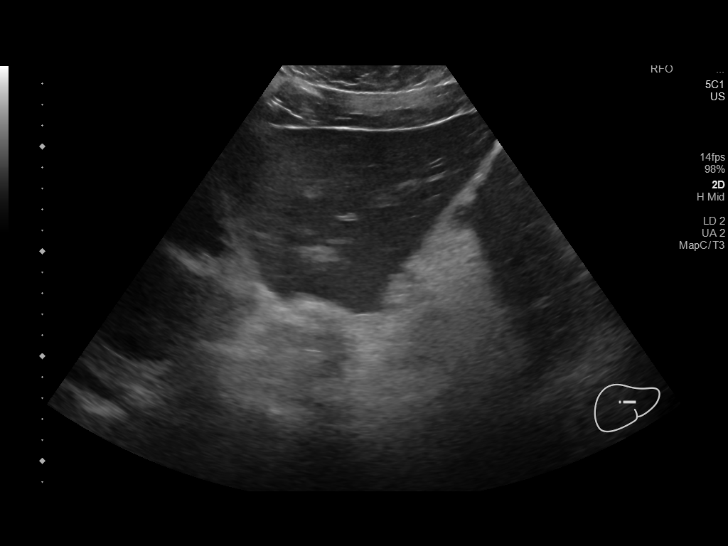
[im 103/206]
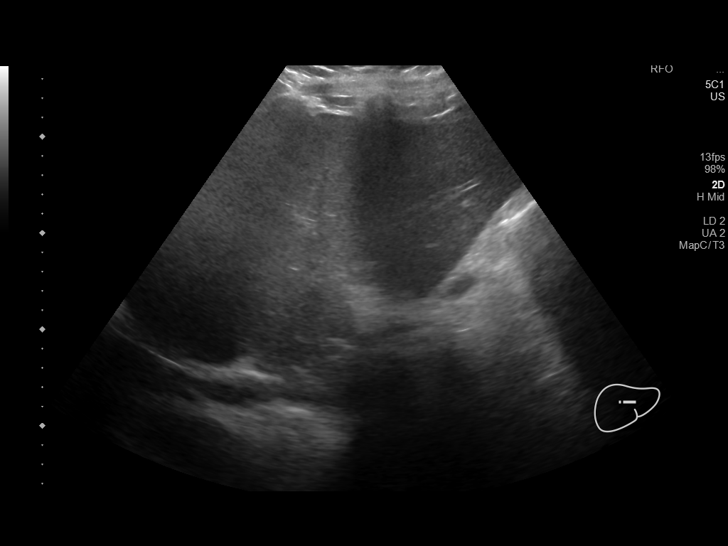
[im 120/206]
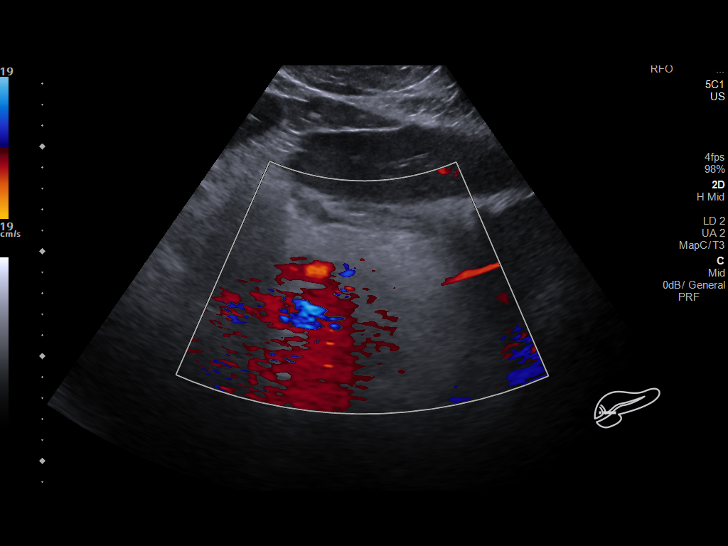
[im 137/206]
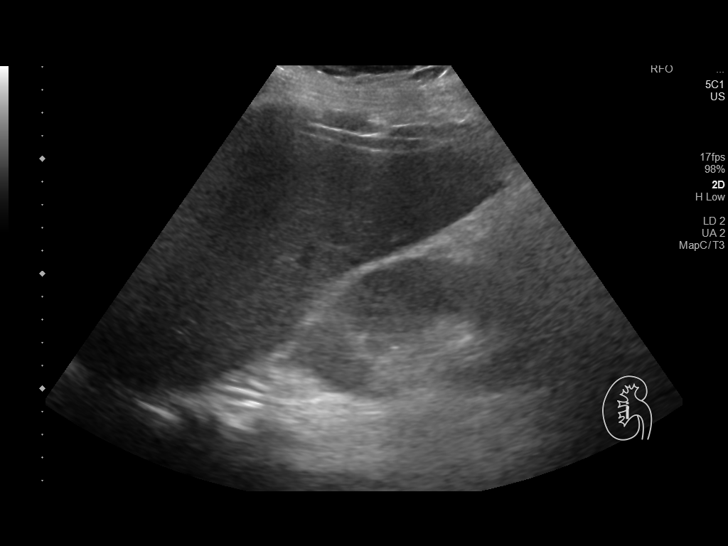
[im 154/206]
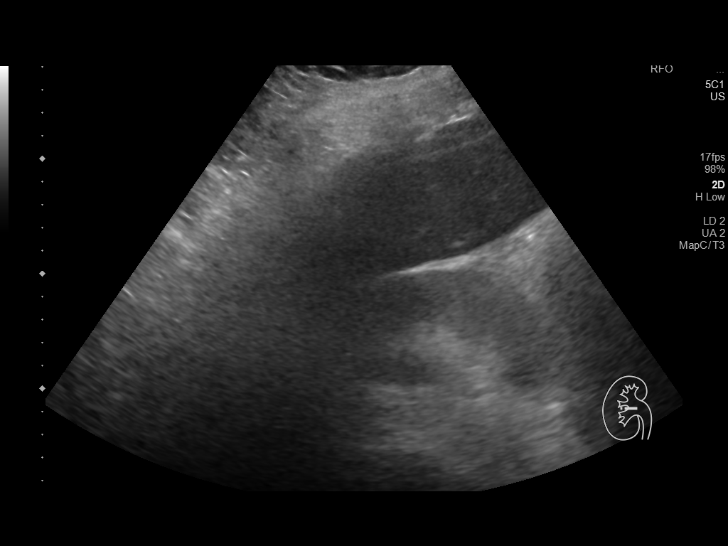
[im 171/206]
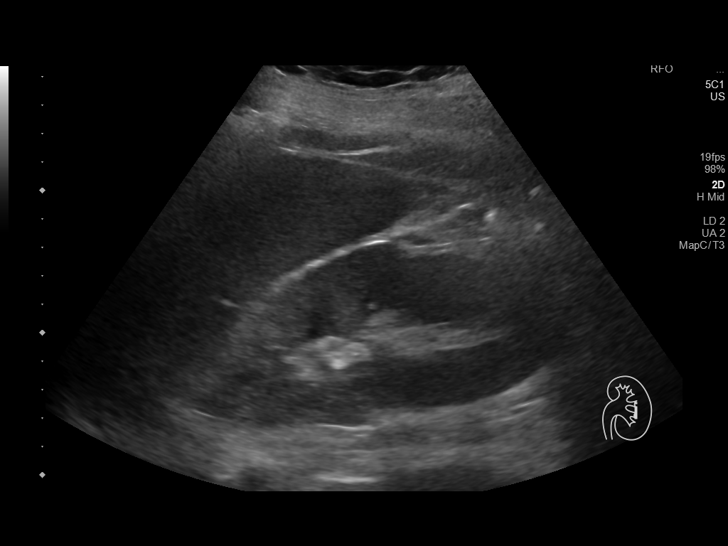
[im 188/206]
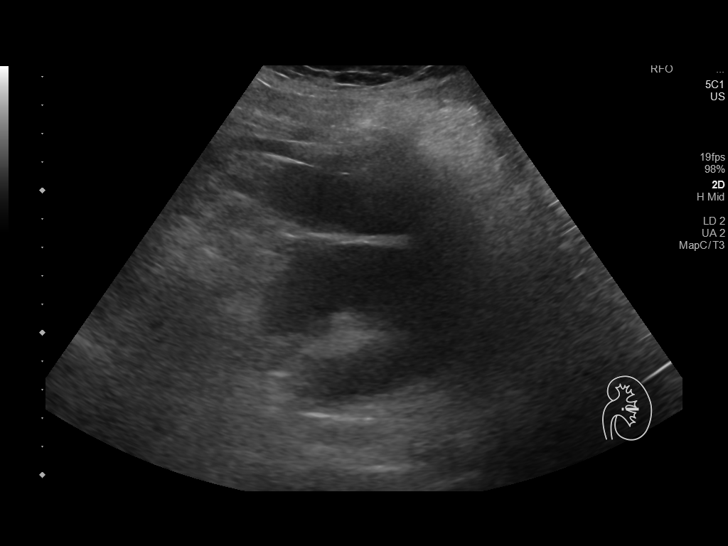
[im 206/206]
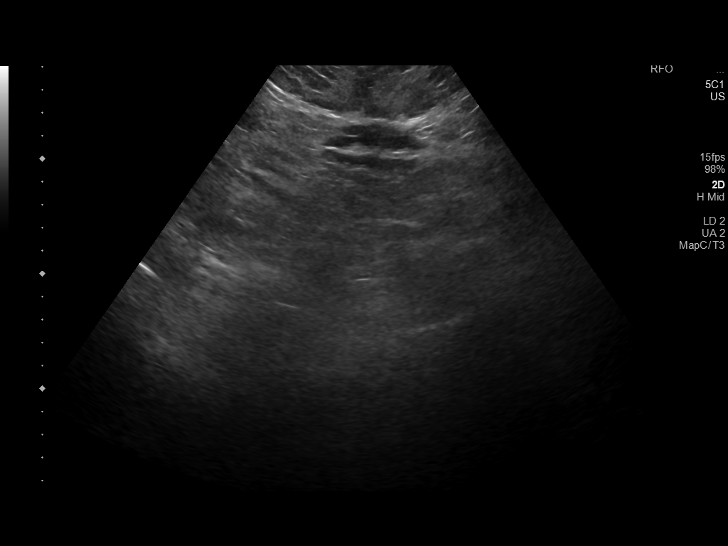

[13 of 25 positions shown; findings below may reference images not displayed]

FINDINGS: Gallbladder: No evidence for gallstones. Gallbladder incompletely
distended. Gallbladder wall thickness measured at 3-4 mm and likely
accentuated by the nondistended state. No pericholecystic fluid.
Sonographer reports no sonographic Murphy sign.

Common bile duct: Diameter: 4 mm

Liver: Coarsening of the echotexture with decreased through
transmission suggests fatty deposition. No focal abnormality. Portal
vein is patent on color Doppler imaging with normal direction of
blood flow towards the liver.

IVC: No abnormality visualized.

Pancreas: Visualized portion unremarkable.

Spleen: Size and appearance within normal limits.

Right Kidney: Length: 12.9 cm. Limited assessment lower pole due to
limitations with patient positioning.

Left Kidney: Length: 13.2 cm. Echogenicity within normal limits. No
mass or hydronephrosis visualized.

Abdominal aorta: No aneurysm visualized.

Other findings: None.
IMPRESSION: 1. No acute findings. Minimal gallbladder wall thickening likely
related to nondistended state of the gallbladder.
2. Coarsening of the hepatic echotexture suggests fatty deposition.

## 2019-09-13 IMAGING — DX DG CHEST 1V
1 series · 1 of 1 positions shown · non-contrast
Comparison: Earlier radiograph dated 01/09/2018

CLINICAL DATA: 27-year-old male status post intubation.

EXAM:
CHEST  1 VIEW

[chest ap]
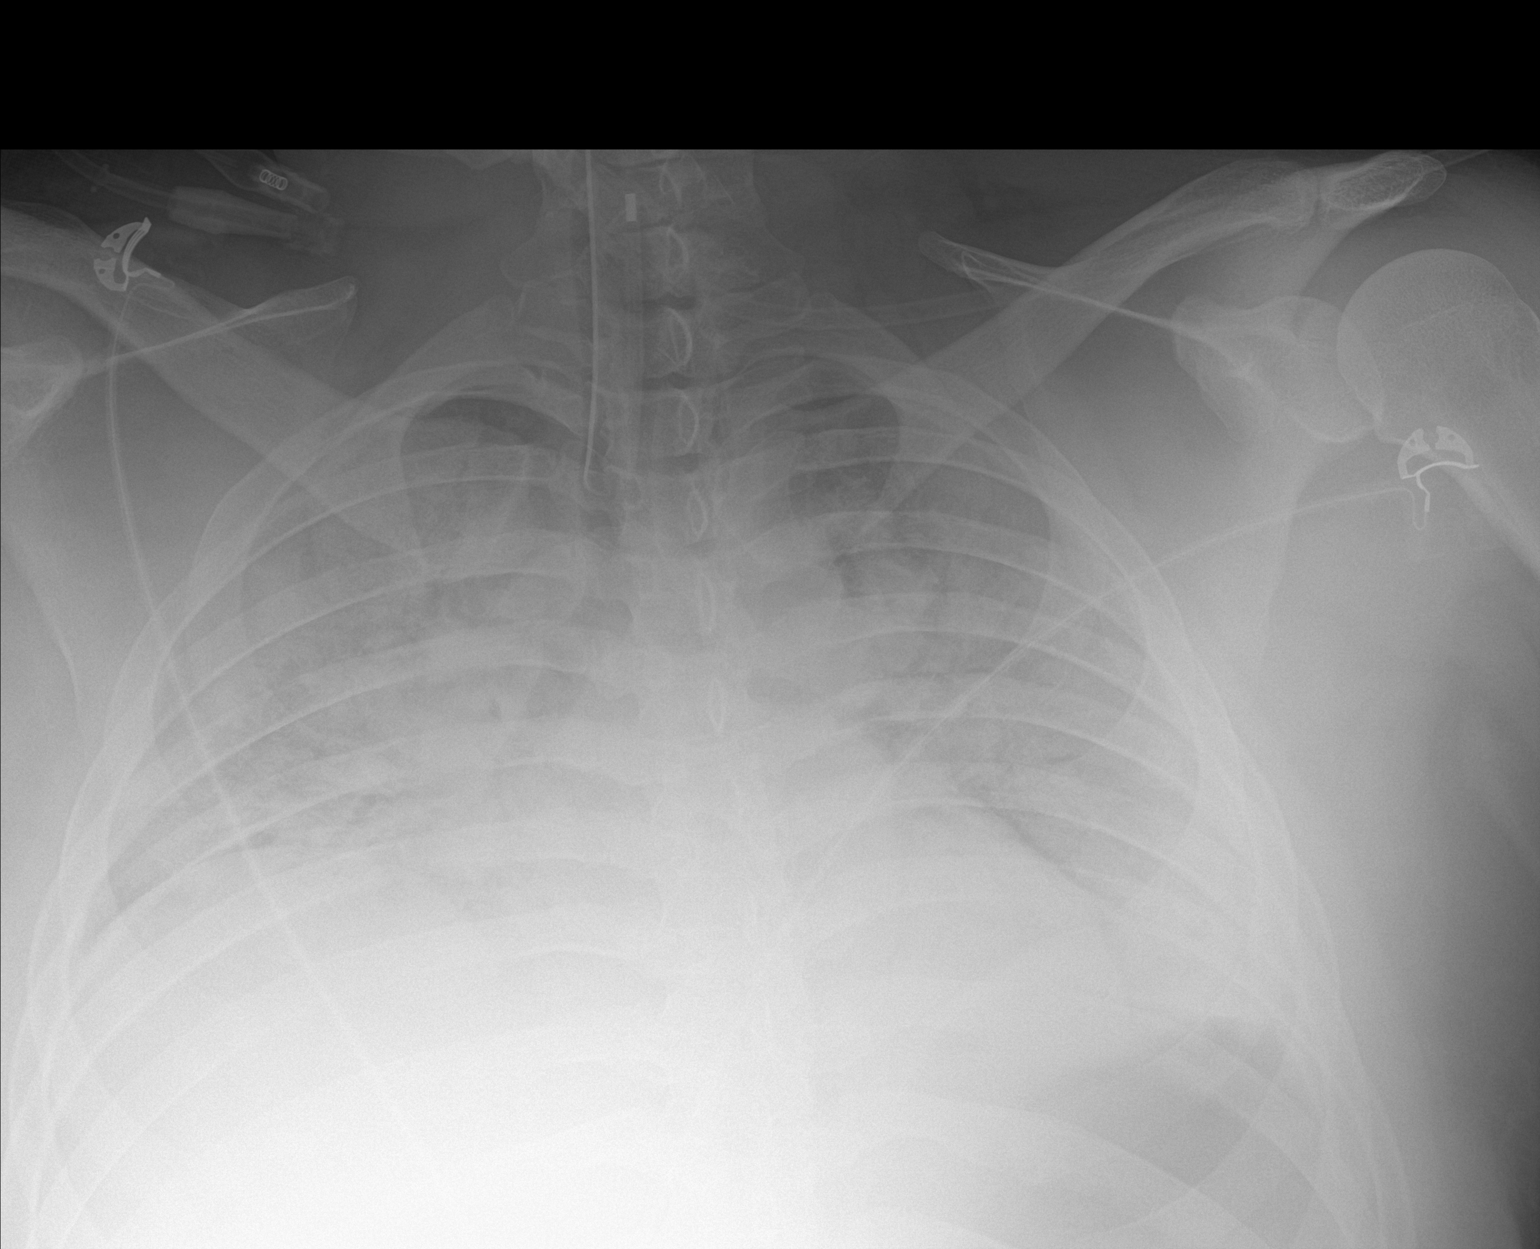

[1 of 1 positions shown; findings below may reference images not displayed]

FINDINGS: There has been anterolisthesis of and tracheal tube with tip
approximately 3 cm above the carina. There is shallow inspiration.
Diffuse bilateral airspace opacities progressed since the earlier
study. Findings may represent worsening pulmonary edema, ARDS, or
pneumonia. Clinical correlation is recommended. There is no
pneumothorax. Stable cardiac silhouette. No acute osseous pathology.
IMPRESSION: 1. Interval placement of an endotracheal tube with tip above the
carina.
2. Diffuse bilateral airspace opacities with interval worsening
since the earlier radiograph.
